# Patient Record
Sex: Female | Born: 1950 | Race: Black or African American | Hispanic: No | Smoking: Former smoker
Health system: Southern US, Community
[De-identification: ages and names within clinical notes are randomized; demographics above are authoritative.]

## PROBLEM LIST (undated history)

## (undated) DIAGNOSIS — E119 Type 2 diabetes mellitus without complications: Secondary | ICD-10-CM

## (undated) DIAGNOSIS — M1990 Unspecified osteoarthritis, unspecified site: Secondary | ICD-10-CM

## (undated) DIAGNOSIS — I639 Cerebral infarction, unspecified: Secondary | ICD-10-CM

## (undated) DIAGNOSIS — I10 Essential (primary) hypertension: Secondary | ICD-10-CM

## (undated) DIAGNOSIS — J40 Bronchitis, not specified as acute or chronic: Secondary | ICD-10-CM

## (undated) HISTORY — PX: CHOLECYSTECTOMY: SHX55

---

## 2002-08-14 ENCOUNTER — Emergency Department (HOSPITAL_COMMUNITY): Admission: EM | Admit: 2002-08-14 | Discharge: 2002-08-14 | Payer: Self-pay | Admitting: Emergency Medicine

## 2002-09-19 ENCOUNTER — Encounter: Payer: Self-pay | Admitting: Emergency Medicine

## 2002-09-19 ENCOUNTER — Emergency Department (HOSPITAL_COMMUNITY): Admission: AD | Admit: 2002-09-19 | Discharge: 2002-09-19 | Payer: Self-pay | Admitting: Emergency Medicine

## 2009-01-21 ENCOUNTER — Ambulatory Visit: Payer: Self-pay | Admitting: Diagnostic Radiology

## 2009-01-21 ENCOUNTER — Emergency Department (HOSPITAL_BASED_OUTPATIENT_CLINIC_OR_DEPARTMENT_OTHER): Admission: EM | Admit: 2009-01-21 | Discharge: 2009-01-22 | Payer: Self-pay | Admitting: Emergency Medicine

## 2009-02-15 ENCOUNTER — Ambulatory Visit: Payer: Self-pay | Admitting: Diagnostic Radiology

## 2009-02-15 ENCOUNTER — Emergency Department (HOSPITAL_BASED_OUTPATIENT_CLINIC_OR_DEPARTMENT_OTHER): Admission: EM | Admit: 2009-02-15 | Discharge: 2009-02-15 | Payer: Self-pay | Admitting: Emergency Medicine

## 2010-03-26 LAB — BASIC METABOLIC PANEL
BUN: 10 mg/dL (ref 6–23)
CO2: 29 mEq/L (ref 19–32)
Calcium: 8.8 mg/dL (ref 8.4–10.5)
Chloride: 104 mEq/L (ref 96–112)
Creatinine, Ser: 0.6 mg/dL (ref 0.4–1.2)
GFR calc Af Amer: >60 mL/min (ref >60)
GFR calc non Af Amer: >60 mL/min (ref >60)
Potassium: 4.1 mEq/L (ref 3.5–5.1)

## 2010-03-26 LAB — DIFFERENTIAL
Basophils Relative: 3 % — ABNORMAL HIGH (ref 0–1)
Eosinophils Relative: 0 % (ref 0–5)
Monocytes Relative: 9 % (ref 3–12)
Neutro Abs: 16 10*3/uL — ABNORMAL HIGH (ref 1.7–7.7)

## 2010-03-26 LAB — CBC
HCT: 40.3 % (ref 36.0–46.0)
WBC: 21.5 10*3/uL — ABNORMAL HIGH (ref 4.0–10.5)

## 2010-03-26 LAB — RAPID STREP SCREEN (MED CTR MEBANE ONLY): Streptococcus, Group A Screen (Direct): NEGATIVE

## 2013-03-31 ENCOUNTER — Emergency Department (HOSPITAL_BASED_OUTPATIENT_CLINIC_OR_DEPARTMENT_OTHER)
Admission: EM | Admit: 2013-03-31 | Discharge: 2013-03-31 | Disposition: A | Discharge status: Home / Self Care | Payer: No Typology Code available for payment source | Attending: Emergency Medicine | Admitting: Emergency Medicine

## 2013-03-31 ENCOUNTER — Encounter (HOSPITAL_BASED_OUTPATIENT_CLINIC_OR_DEPARTMENT_OTHER): Payer: Self-pay | Admitting: Emergency Medicine

## 2013-03-31 DIAGNOSIS — Z79899 Other long term (current) drug therapy: Secondary | ICD-10-CM | POA: Insufficient documentation

## 2013-03-31 DIAGNOSIS — M129 Arthropathy, unspecified: Secondary | ICD-10-CM | POA: Insufficient documentation

## 2013-03-31 DIAGNOSIS — E119 Type 2 diabetes mellitus without complications: Secondary | ICD-10-CM | POA: Insufficient documentation

## 2013-03-31 DIAGNOSIS — H6090 Unspecified otitis externa, unspecified ear: Secondary | ICD-10-CM

## 2013-03-31 DIAGNOSIS — Z7982 Long term (current) use of aspirin: Secondary | ICD-10-CM | POA: Insufficient documentation

## 2013-03-31 DIAGNOSIS — Z792 Long term (current) use of antibiotics: Secondary | ICD-10-CM | POA: Insufficient documentation

## 2013-03-31 DIAGNOSIS — Z8709 Personal history of other diseases of the respiratory system: Secondary | ICD-10-CM | POA: Insufficient documentation

## 2013-03-31 DIAGNOSIS — Z91040 Latex allergy status: Secondary | ICD-10-CM | POA: Insufficient documentation

## 2013-03-31 DIAGNOSIS — Z8673 Personal history of transient ischemic attack (TIA), and cerebral infarction without residual deficits: Secondary | ICD-10-CM | POA: Insufficient documentation

## 2013-03-31 DIAGNOSIS — H60399 Other infective otitis externa, unspecified ear: Secondary | ICD-10-CM | POA: Insufficient documentation

## 2013-03-31 DIAGNOSIS — I10 Essential (primary) hypertension: Secondary | ICD-10-CM | POA: Insufficient documentation

## 2013-03-31 DIAGNOSIS — Z87891 Personal history of nicotine dependence: Secondary | ICD-10-CM | POA: Insufficient documentation

## 2013-03-31 HISTORY — DX: Cerebral infarction, unspecified: I63.9

## 2013-03-31 HISTORY — DX: Type 2 diabetes mellitus without complications: E11.9

## 2013-03-31 HISTORY — DX: Essential (primary) hypertension: I10

## 2013-03-31 HISTORY — DX: Bronchitis, not specified as acute or chronic: J40

## 2013-03-31 HISTORY — DX: Unspecified osteoarthritis, unspecified site: M19.90

## 2013-03-31 MED ORDER — AMOXICILLIN-POT CLAVULANATE 875-125 MG PO TABS: 1.0000 | ORAL_TABLET | Freq: Two times a day (BID) | ORAL | Status: DC

## 2013-03-31 NOTE — ED Provider Notes (Signed)
CSN: [id number]     Arrival date & time 03/31/13  1926 History   First MD Initiated Contact with Patient 03/31/13 1949     Chief Complaint  Patient presents with  . Otalgia     (Consider location/radiation/quality/duration/timing/severity/associated sxs/prior Treatment) HPI Comments: Pt states that she started having left ear pain 9 days ago. Pt was seen by her pcp a couple of days ago and started on cortisporin, augmentin and hydrocodone. Pt states that in the last 2 days the symptoms worsened:no fever  The history is provided by the patient. No language interpreter was used.    Past Medical History  Diagnosis Date  . Hypertension   . Diabetes mellitus without complication   . CVA (cerebral infarction)   . Arthritis   . Bronchitis    Past Surgical History  Procedure Laterality Date  . Cesarean section    . Cholecystectomy     No family history on file. History  Substance Use Topics  . Smoking status: Former Games developermoker  . Smokeless tobacco: Not on file  . Alcohol Use: No   OB History   Grav Para Term Preterm Abortions TAB SAB Ect Mult Living                 Review of Systems  Constitutional: Negative.   Respiratory: Negative.   Cardiovascular: Negative.       Allergies  Latex and Vancomycin  Home Medications   Current Outpatient Rx  Name  Route  Sig  Dispense  Refill  . amoxicillin (AMOXIL) 125 MG chewable tablet   Oral   Chew 125 mg by mouth 3 (three) times daily.         Marland Kitchen. aspirin 81 MG tablet   Oral   Take 81 mg by mouth daily.         . citalopram (CELEXA) 10 MG tablet   Oral   Take 10 mg by mouth daily.         . hydrochlorothiazide (HYDRODIURIL) 25 MG tablet   Oral   Take 25 mg by mouth daily.         Marland Kitchen. loratadine (CLARITIN) 10 MG tablet   Oral   Take 10 mg by mouth daily.         Marland Kitchen. losartan (COZAAR) 100 MG tablet   Oral   Take 100 mg by mouth daily.         . metFORMIN (GLUCOPHAGE) 1000 MG tablet   Oral   Take 1,000 mg by  mouth 2 (two) times daily with a meal.         . NEOMYCIN-POLYMYXIN-HYDROCORTISONE (CORTISPORIN) 1 % SOLN otic solution      3 drops 4 (four) times daily.         . traMADol (ULTRAM) 50 MG tablet   Oral   Take by mouth every 6 (six) hours as needed.         Marland Kitchen. amoxicillin-clavulanate (AUGMENTIN) 875-125 MG per tablet   Oral   Take 1 tablet by mouth every 12 (twelve) hours.   14 tablet   0    BP 147/78  Pulse 86  Temp(Src) 98.9 F (37.2 C) (Oral)  Resp 16  Ht 5\' 2"  (1.575 m)  Wt 270 lb (122.471 kg)  BMI 49.37 kg/m2  SpO2 100% Physical Exam  Nursing note reviewed. HENT:  Right Ear: Tympanic membrane and external ear normal.  Mouth/Throat: Oropharynx is clear and moist.  Left ear canal is closed with swelling. Ear wick  placed  Eyes: Conjunctivae and EOM are normal. Pupils are equal, round, and reactive to light.  Cardiovascular: Normal rate and regular rhythm.   Pulmonary/Chest: Effort normal and breath sounds normal.  Musculoskeletal: Normal range of motion.    ED Course  Procedures (including critical care time) Labs Review Labs Reviewed - No data to display Imaging Review No results found.   EKG Interpretation None      MDM   Final diagnoses:  Otitis externa    Ear wick placed. Amoxicillin switched to augmentin. Pt given referral to ent    Teressa Lower, NP 03/31/13 2100

## 2013-03-31 NOTE — ED Notes (Signed)
Left ear pain x9 days.  Treated by pmd with oral and topical abx.  Started getting worse 2 days ago.  Swelling and drainage of left ear canal today.

## 2013-03-31 NOTE — ED Provider Notes (Signed)
Medical screening examination/treatment/procedure(s) were performed by non-physician practitioner and as supervising physician I was immediately available for consultation/collaboration.   EKG Interpretation None        Zeddie Njie B. Shelly Shoultz, MD 03/31/13 2225 

## 2013-03-31 NOTE — Discharge Instructions (Signed)
Otitis Externa  Otitis externa is a germ infection in the outer ear. The outer ear is the area from the eardrum to the outside of the ear. Otitis externa is sometimes called "swimmer's ear."  HOME CARE   Put drops in the ear as told by your doctor.   Only take medicine as told by your doctor.   If you have diabetes, your doctor may give you more directions. Follow your doctor's directions.   Keep all doctor visits as told.  To avoid another infection:   Keep your ear dry. Use the corner of a towel to dry your ear after swimming or bathing.   Avoid scratching or putting things inside your ear.   Avoid swimming in lakes, dirty water, or pools that use a chemical called chlorine poorly.   You may use ear drops after swimming. Combine equal amounts of white vinegar and alcohol in a bottle. Put 3 or 4 drops in each ear.  GET HELP RIGHT AWAY IF:    You have a fever.   Your ear is still red, puffy (swollen), or painful after 3 days.   You still have yellowish-white fluid (pus) coming from the ear after 3 days.   Your redness, puffiness, or pain gets worse.   You have a really bad headache.   You have redness, puffiness, pain, or tenderness behind your ear.  MAKE SURE YOU:    Understand these instructions.   Will watch your condition.   Will get help right away if you are not doing well or get worse.  Document Released: 06/13/2007 Document Revised: 03/19/2011 Document Reviewed: 01/11/2011  [employer] Patient Information 2014 [employer].

## 2016-05-12 ENCOUNTER — Encounter (HOSPITAL_BASED_OUTPATIENT_CLINIC_OR_DEPARTMENT_OTHER): Payer: Self-pay | Admitting: *Deleted

## 2016-05-12 DIAGNOSIS — R109 Unspecified abdominal pain: Secondary | ICD-10-CM | POA: Diagnosis present

## 2016-05-12 DIAGNOSIS — Z7984 Long term (current) use of oral hypoglycemic drugs: Secondary | ICD-10-CM | POA: Diagnosis not present

## 2016-05-12 DIAGNOSIS — Z79899 Other long term (current) drug therapy: Secondary | ICD-10-CM | POA: Insufficient documentation

## 2016-05-12 DIAGNOSIS — Z87891 Personal history of nicotine dependence: Secondary | ICD-10-CM | POA: Diagnosis not present

## 2016-05-12 DIAGNOSIS — I10 Essential (primary) hypertension: Secondary | ICD-10-CM | POA: Diagnosis not present

## 2016-05-12 DIAGNOSIS — M62838 Other muscle spasm: Secondary | ICD-10-CM | POA: Diagnosis not present

## 2016-05-12 DIAGNOSIS — Z7982 Long term (current) use of aspirin: Secondary | ICD-10-CM | POA: Insufficient documentation

## 2016-05-12 DIAGNOSIS — E119 Type 2 diabetes mellitus without complications: Secondary | ICD-10-CM | POA: Diagnosis not present

## 2016-05-12 LAB — URINALYSIS, ROUTINE W REFLEX MICROSCOPIC
BILIRUBIN URINE: NEGATIVE
Glucose, UA: NEGATIVE mg/dL
HGB URINE DIPSTICK: NEGATIVE
Ketones, ur: NEGATIVE mg/dL
Nitrite: NEGATIVE
PH: 5.5 (ref 5.0–8.0)
Protein, ur: NEGATIVE mg/dL
SPECIFIC GRAVITY, URINE: 1.027 (ref 1.005–1.030)

## 2016-05-12 LAB — URINALYSIS, MICROSCOPIC (REFLEX)

## 2016-05-12 NOTE — ED Triage Notes (Signed)
Pt states she has been having a pain on her left side/flank for several days. She says that the pain is causing her to be short of breath. The area hurts to touch and "feels like something is crawling". Pt has taken tylenol and medicine for indigestion without relief.

## 2016-05-13 ENCOUNTER — Emergency Department (HOSPITAL_BASED_OUTPATIENT_CLINIC_OR_DEPARTMENT_OTHER): Payer: Medicare HMO

## 2016-05-13 ENCOUNTER — Emergency Department (HOSPITAL_BASED_OUTPATIENT_CLINIC_OR_DEPARTMENT_OTHER)
Admission: EM | Admit: 2016-05-13 | Discharge: 2016-05-13 | Disposition: A | Discharge status: Home / Self Care | Payer: Medicare HMO | Attending: Physician Assistant | Admitting: Physician Assistant

## 2016-05-13 DIAGNOSIS — M62838 Other muscle spasm: Secondary | ICD-10-CM

## 2016-05-13 MED ORDER — LIDOCAINE 5 % EX PTCH: 1.0000 | MEDICATED_PATCH | CUTANEOUS | 0 refills | Status: DC

## 2016-05-13 MED ORDER — IBUPROFEN 600 MG PO TABS: 600.0000 mg | ORAL_TABLET | Freq: Three times a day (TID) | ORAL | 0 refills | Status: DC

## 2016-05-13 MED ORDER — CYCLOBENZAPRINE HCL 10 MG PO TABS: 10.0000 mg | ORAL_TABLET | Freq: Two times a day (BID) | ORAL | 0 refills | Status: DC | PRN

## 2016-05-13 MED ORDER — LIDOCAINE 5 % EX PTCH
1.0000 | MEDICATED_PATCH | CUTANEOUS | Status: DC
  Filled 2016-05-13: qty 1

## 2016-05-13 MED ORDER — IBUPROFEN 800 MG PO TABS
800.0000 mg | ORAL_TABLET | Freq: Once | ORAL | Status: AC
  Administered 2016-05-13: 800 mg via ORAL
  Filled 2016-05-13: qty 1

## 2016-05-13 NOTE — ED Provider Notes (Addendum)
[facility] Provider Note   CSN: [id number] Arrival date & time: 05/12/16  2238   By signing my name below, I, Clarisse GougeXavier Herndon, attest that this documentation has been prepared under the direction and in the presence of Aadin Gaut, Cindee Saltourteney Lyn, MD. Electronically signed, Clarisse GougeXavier Herndon, ED Scribe. 05/13/16. 12:45 AM.   History   Chief Complaint Chief Complaint  Patient presents with  . Flank Pain   The history is provided by the patient and medical records. No language interpreter was used.  Flank Pain  This is a new problem. The current episode started more than 2 days ago. The problem occurs constantly. The problem has been gradually worsening. Pertinent negatives include no chest pain.    Paige Porter is a 66 y.o. female DM and 2 surgery repairs, who presents to the Emergency Department with concern for L flank pain x 2-3 days. She describes severe, intermittent, "grabbing" pain that feels like something is moving in her side. This pain is reportedly worse with movement, contact and walking. She has attempted baths with alcohol and consumption of mustard without relief. Pt took tylenol ~1 PM yesterday. No other modifying factors noted. No activity change, chest pain or any other complaints noted at this time.   Past Medical History:  Diagnosis Date  . Arthritis   . Bronchitis   . CVA (cerebral infarction)   . Diabetes mellitus without complication (HCC)   . Hypertension     There are no active problems to display for this patient.   Past Surgical History:  Procedure Laterality Date  . CESAREAN SECTION    . CHOLECYSTECTOMY      OB History    No data available       Home Medications    Prior to Admission medications   Medication Sig Start Date End Date Taking? Authorizing Provider  amoxicillin (AMOXIL) 125 MG chewable tablet Chew 125 mg by mouth 3 (three) times daily.    [provider]  amoxicillin-clavulanate (AUGMENTIN) 875-125 MG per  tablet Take 1 tablet by mouth every 12 (twelve) hours. 03/31/13   Teressa LowerPickering, Vrinda, NP  aspirin 81 MG tablet Take 81 mg by mouth daily.    [provider]  citalopram (CELEXA) 10 MG tablet Take 10 mg by mouth daily.    [provider]  hydrochlorothiazide (HYDRODIURIL) 25 MG tablet Take 25 mg by mouth daily.    [provider]  loratadine (CLARITIN) 10 MG tablet Take 10 mg by mouth daily.    [provider]  losartan (COZAAR) 100 MG tablet Take 100 mg by mouth daily.    [provider]  metFORMIN (GLUCOPHAGE) 1000 MG tablet Take 1,000 mg by mouth 2 (two) times daily with a meal.    [provider]  NEOMYCIN-POLYMYXIN-HYDROCORTISONE (CORTISPORIN) 1 % SOLN otic solution 3 drops 4 (four) times daily.    [provider]  traMADol (ULTRAM) 50 MG tablet Take by mouth every 6 (six) hours as needed.    [provider]    Family History No family history on file.  Social History Social History  Substance Use Topics  . Smoking status: Former Games developermoker  . Smokeless tobacco: Not on file  . Alcohol use No     Allergies   Latex and Vancomycin   Review of Systems Review of Systems  Constitutional: Negative for activity change and fever.  Cardiovascular: Negative for chest pain.  Genitourinary: Positive for flank pain.  Skin: Negative for wound.  All other systems  reviewed and are negative.    Physical Exam Updated Vital Signs BP (!) 143/54 (BP Location: Right Arm)   Pulse 85   Temp 99.1 F (37.3 C) (Oral)   Resp 18   Ht 5\' 2"  (1.575 m)   Wt 260 lb (117.9 kg)   SpO2 96%   BMI 47.55 kg/m   Physical Exam  Constitutional: She is oriented to person, place, and time. She appears well-developed and well-nourished. No distress.  HENT:  Head: Normocephalic and atraumatic.  Eyes: EOM are normal.  Neck: Normal range of motion.  Cardiovascular: Normal rate, regular rhythm and normal heart sounds.   Pulmonary/Chest:  Effort normal and breath sounds normal. She exhibits tenderness.  TTP on L side wall  Abdominal: Soft. She exhibits no distension. There is no tenderness.  Musculoskeletal: Normal range of motion.  Neurological: She is alert and oriented to person, place, and time.  Skin: Skin is warm and dry.  Psychiatric: She has a normal mood and affect. Judgment normal.  Nursing note and vitals reviewed.    ED Treatments / Results  DIAGNOSTIC STUDIES: Oxygen Saturation is 96% on RA, NL by my interpretation.    COORDINATION OF CARE: 12:32 AM-Discussed next steps with pt. Pt verbalized understanding and is agreeable with the plan. Will Rx medications, review labs and order imaging. Pt prepared for d/c, advised of symptomatic care at home and return precautions.    Labs (all labs ordered are listed, but only abnormal results are displayed) Labs Reviewed  URINALYSIS, ROUTINE W REFLEX MICROSCOPIC - Abnormal; Notable for the following:       Result Value   Leukocytes, UA SMALL (*)    All other components within normal limits  URINALYSIS, MICROSCOPIC (REFLEX) - Abnormal; Notable for the following:    Bacteria, UA FEW (*)    Squamous Epithelial / LPF 0-5 (*)    All other components within normal limits    EKG  EKG Interpretation None       Radiology No results found.  Procedures Procedures (including critical care time)  Medications Ordered in ED Medications - No data to display   Initial Impression / Assessment and Plan / ED Course  I have reviewed the triage vital signs and the nursing notes.  Pertinent labs & imaging results that were available during my care of the patient were reviewed by me and considered in my medical decision making (see chart for details).     Patient is well-appearing 66 year old female presenting with chest wall pain to left lateral side. Patient reports is worse with movement. Tender to the touch. No known trauma. Patient's tried multiple home remedies  with no success. I believe this is musculoskeletal in nature. We'll make sure that there is no underlying pneumonia, or occult fracture with an x-ray. We'll give lidocaine patch, ibuprofen, muscle relaxant and follow-up with primary care physician. Does not sound pleuritic in nature nor cardiac in nature.  Final Clinical Impressions(s) / ED Diagnoses   Final diagnoses:  None    New Prescriptions New Prescriptions   No medications on file  I personally performed the services described in this documentation, which was scribed in my presence. The recorded information has been reviewed and is accurate.      Abelino Derrick, MD 05/13/16 0121    Abelino Derrick, MD 05/13/16 9562

## 2016-05-13 NOTE — Discharge Instructions (Signed)
Your chest x-ray and urine were normal. We think this is likely a muscle spasm in your chest wall. Please use the medications provided and the lidocaine patch as needed.

## 2016-05-15 ENCOUNTER — Encounter (HOSPITAL_BASED_OUTPATIENT_CLINIC_OR_DEPARTMENT_OTHER): Payer: Self-pay | Admitting: Emergency Medicine

## 2016-05-15 ENCOUNTER — Emergency Department (HOSPITAL_BASED_OUTPATIENT_CLINIC_OR_DEPARTMENT_OTHER)
Admission: EM | Admit: 2016-05-15 | Discharge: 2016-05-15 | Disposition: A | Discharge status: Home / Self Care | Payer: Medicare HMO | Attending: Emergency Medicine | Admitting: Emergency Medicine

## 2016-05-15 ENCOUNTER — Emergency Department (HOSPITAL_BASED_OUTPATIENT_CLINIC_OR_DEPARTMENT_OTHER): Payer: Medicare HMO

## 2016-05-15 DIAGNOSIS — E119 Type 2 diabetes mellitus without complications: Secondary | ICD-10-CM | POA: Diagnosis not present

## 2016-05-15 DIAGNOSIS — Z79899 Other long term (current) drug therapy: Secondary | ICD-10-CM | POA: Insufficient documentation

## 2016-05-15 DIAGNOSIS — Z7984 Long term (current) use of oral hypoglycemic drugs: Secondary | ICD-10-CM | POA: Insufficient documentation

## 2016-05-15 DIAGNOSIS — Y999 Unspecified external cause status: Secondary | ICD-10-CM | POA: Diagnosis not present

## 2016-05-15 DIAGNOSIS — I10 Essential (primary) hypertension: Secondary | ICD-10-CM | POA: Insufficient documentation

## 2016-05-15 DIAGNOSIS — S161XXA Strain of muscle, fascia and tendon at neck level, initial encounter: Secondary | ICD-10-CM

## 2016-05-15 DIAGNOSIS — Z87891 Personal history of nicotine dependence: Secondary | ICD-10-CM | POA: Diagnosis not present

## 2016-05-15 DIAGNOSIS — Z7982 Long term (current) use of aspirin: Secondary | ICD-10-CM | POA: Diagnosis not present

## 2016-05-15 DIAGNOSIS — S0990XA Unspecified injury of head, initial encounter: Secondary | ICD-10-CM | POA: Diagnosis present

## 2016-05-15 DIAGNOSIS — Y92410 Unspecified street and highway as the place of occurrence of the external cause: Secondary | ICD-10-CM | POA: Diagnosis not present

## 2016-05-15 DIAGNOSIS — Y9389 Activity, other specified: Secondary | ICD-10-CM | POA: Insufficient documentation

## 2016-05-15 DIAGNOSIS — R51 Headache: Secondary | ICD-10-CM

## 2016-05-15 DIAGNOSIS — R519 Headache, unspecified: Secondary | ICD-10-CM

## 2016-05-15 MED ORDER — NAPROXEN 250 MG PO TABS
250.0000 mg | ORAL_TABLET | Freq: Once | ORAL | Status: AC
  Administered 2016-05-15: 250 mg via ORAL
  Filled 2016-05-15: qty 1

## 2016-05-15 MED ORDER — NAPROXEN 250 MG PO TABS: 375.0000 mg | ORAL_TABLET | Freq: Once | ORAL | Status: DC

## 2016-05-15 NOTE — Discharge Instructions (Signed)
All of your imaging was normal. This is likely musculoskeletal pain and spasms. Take the Flexeril that she had at home. May use Motrin or Tylenol for pain. Warm compresses to the affected area. Warm soaks in Epsom salt. Follow-up with primary care doctor if symptoms are not improved or return to ED if her symptoms worsen. Return to the emergency department including any new  severe headaches, disequilibrium, vomiting, double vision, extremity weakness, difficulty ambulating, or any other concerning symptoms
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
## 2016-05-15 NOTE — ED Notes (Signed)
Pt verbalizes understanding of d/c instructions and denies any further needs at this time. 

## 2016-10-23 ENCOUNTER — Emergency Department (HOSPITAL_BASED_OUTPATIENT_CLINIC_OR_DEPARTMENT_OTHER): Payer: Medicare HMO

## 2016-10-23 ENCOUNTER — Emergency Department (HOSPITAL_BASED_OUTPATIENT_CLINIC_OR_DEPARTMENT_OTHER)
Admission: EM | Admit: 2016-10-23 | Discharge: 2016-10-23 | Disposition: A | Discharge status: Home / Self Care | Payer: Medicare HMO | Attending: Emergency Medicine | Admitting: Emergency Medicine

## 2016-10-23 ENCOUNTER — Encounter (HOSPITAL_BASED_OUTPATIENT_CLINIC_OR_DEPARTMENT_OTHER): Payer: Self-pay

## 2016-10-23 DIAGNOSIS — E119 Type 2 diabetes mellitus without complications: Secondary | ICD-10-CM | POA: Insufficient documentation

## 2016-10-23 DIAGNOSIS — Z87891 Personal history of nicotine dependence: Secondary | ICD-10-CM | POA: Diagnosis not present

## 2016-10-23 DIAGNOSIS — Z79899 Other long term (current) drug therapy: Secondary | ICD-10-CM | POA: Diagnosis not present

## 2016-10-23 DIAGNOSIS — K5732 Diverticulitis of large intestine without perforation or abscess without bleeding: Secondary | ICD-10-CM | POA: Diagnosis not present

## 2016-10-23 DIAGNOSIS — R1032 Left lower quadrant pain: Secondary | ICD-10-CM

## 2016-10-23 DIAGNOSIS — Z9049 Acquired absence of other specified parts of digestive tract: Secondary | ICD-10-CM | POA: Diagnosis not present

## 2016-10-23 DIAGNOSIS — Z7984 Long term (current) use of oral hypoglycemic drugs: Secondary | ICD-10-CM | POA: Insufficient documentation

## 2016-10-23 DIAGNOSIS — Z7982 Long term (current) use of aspirin: Secondary | ICD-10-CM | POA: Insufficient documentation

## 2016-10-23 DIAGNOSIS — I10 Essential (primary) hypertension: Secondary | ICD-10-CM | POA: Diagnosis not present

## 2016-10-23 DIAGNOSIS — K5792 Diverticulitis of intestine, part unspecified, without perforation or abscess without bleeding: Secondary | ICD-10-CM

## 2016-10-23 DIAGNOSIS — Z91040 Latex allergy status: Secondary | ICD-10-CM | POA: Diagnosis not present

## 2016-10-23 DIAGNOSIS — Z8673 Personal history of transient ischemic attack (TIA), and cerebral infarction without residual deficits: Secondary | ICD-10-CM | POA: Diagnosis not present

## 2016-10-23 LAB — COMPREHENSIVE METABOLIC PANEL
ALT: 12 U/L — AB (ref 14–54)
ANION GAP: 8 (ref 5–15)
AST: 17 U/L (ref 15–41)
Albumin: 3.2 g/dL — ABNORMAL LOW (ref 3.5–5.0)
Alkaline Phosphatase: 65 U/L (ref 38–126)
BUN: 13 mg/dL (ref 6–20)
CHLORIDE: 104 mmol/L (ref 101–111)
CO2: 27 mmol/L (ref 22–32)
CREATININE: 0.74 mg/dL (ref 0.44–1.00)
Calcium: 8.9 mg/dL (ref 8.9–10.3)
GFR calc non Af Amer: >60 mL/min (ref >60)
Glucose, Bld: 198 mg/dL — ABNORMAL HIGH (ref 65–99)
POTASSIUM: 3.2 mmol/L — AB (ref 3.5–5.1)
SODIUM: 139 mmol/L (ref 135–145)
Total Bilirubin: 0.7 mg/dL (ref 0.3–1.2)
Total Protein: 7.3 g/dL (ref 6.5–8.1)

## 2016-10-23 LAB — CBC WITH DIFFERENTIAL/PLATELET
Basophils Absolute: 0 10*3/uL (ref 0.0–0.1)
Basophils Relative: 0 %
EOS ABS: 0.2 10*3/uL (ref 0.0–0.7)
EOS PCT: 1 %
HCT: 37.2 % (ref 36.0–46.0)
Hemoglobin: 11.9 g/dL — ABNORMAL LOW (ref 12.0–15.0)
LYMPHS ABS: 3.3 10*3/uL (ref 0.7–4.0)
Lymphocytes Relative: 22 %
MCH: 24.9 pg — AB (ref 26.0–34.0)
MCHC: 32 g/dL (ref 30.0–36.0)
MCV: 78 fL (ref 78.0–100.0)
MONO ABS: 1.3 10*3/uL — AB (ref 0.1–1.0)
Monocytes Relative: 9 %
Neutro Abs: 10.1 10*3/uL — ABNORMAL HIGH (ref 1.7–7.7)
Neutrophils Relative %: 68 %
PLATELETS: 290 10*3/uL (ref 150–400)
RBC: 4.77 MIL/uL (ref 3.87–5.11)
RDW: 17 % — AB (ref 11.5–15.5)
WBC: 14.9 10*3/uL — AB (ref 4.0–10.5)

## 2016-10-23 LAB — URINALYSIS, ROUTINE W REFLEX MICROSCOPIC
Bilirubin Urine: NEGATIVE
GLUCOSE, UA: NEGATIVE mg/dL
HGB URINE DIPSTICK: NEGATIVE
KETONES UR: NEGATIVE mg/dL
Leukocytes, UA: NEGATIVE
Nitrite: NEGATIVE
PROTEIN: NEGATIVE mg/dL
Specific Gravity, Urine: 1.025 (ref 1.005–1.030)
pH: 6 (ref 5.0–8.0)

## 2016-10-23 LAB — LIPASE, BLOOD: LIPASE: 22 U/L (ref 11–51)

## 2016-10-23 LAB — OCCULT BLOOD X 1 CARD TO LAB, STOOL: FECAL OCCULT BLD: POSITIVE — AB

## 2016-10-23 MED ORDER — SODIUM CHLORIDE 0.9 % IV BOLUS (SEPSIS)
1000.0000 mL | Freq: Once | INTRAVENOUS | Status: AC
  Administered 2016-10-23: 1000 mL via INTRAVENOUS

## 2016-10-23 MED ORDER — IOPAMIDOL (ISOVUE-300) INJECTION 61%
100.0000 mL | Freq: Once | INTRAVENOUS | Status: AC | PRN
  Administered 2016-10-23: 100 mL via INTRAVENOUS

## 2016-10-23 MED ORDER — MORPHINE SULFATE 15 MG PO TABS: 7.5000 mg | ORAL_TABLET | ORAL | 0 refills | Status: DC | PRN

## 2016-10-23 MED ORDER — ONDANSETRON HCL 4 MG/2ML IJ SOLN
4.0000 mg | Freq: Once | INTRAMUSCULAR | Status: AC
  Administered 2016-10-23: 4 mg via INTRAVENOUS
  Filled 2016-10-23: qty 2

## 2016-10-23 MED ORDER — MORPHINE SULFATE (PF) 4 MG/ML IV SOLN
4.0000 mg | Freq: Once | INTRAVENOUS | Status: AC
Start: 2016-10-23 — End: 2016-10-23
  Administered 2016-10-23: 4 mg via INTRAVENOUS
  Filled 2016-10-23: qty 1

## 2016-10-23 MED ORDER — AMOXICILLIN-POT CLAVULANATE 875-125 MG PO TABS
1.0000 | ORAL_TABLET | Freq: Once | ORAL | Status: AC
  Administered 2016-10-23: 1 via ORAL
  Filled 2016-10-23: qty 1

## 2016-10-23 MED ORDER — AMOXICILLIN-POT CLAVULANATE 875-125 MG PO TABS: 1.0000 | ORAL_TABLET | Freq: Two times a day (BID) | ORAL | 0 refills | Status: AC

## 2016-10-23 MED ORDER — ONDANSETRON 4 MG PO TBDP: ORAL_TABLET | ORAL | 0 refills | Status: DC

## 2016-10-23 NOTE — ED Triage Notes (Signed)
C/o abd pain, diarrhea x 4 days-ambulated into ED WR-presents to triage in w/c

## 2016-10-23 NOTE — ED Notes (Signed)
Patient transported to CT 

## 2016-10-23 NOTE — ED Provider Notes (Signed)
[facility] EMERGENCY DEPARTMENT Provider Note   CSN: [id number] Arrival date & time: 10/23/16  1402     History   Chief Complaint Chief Complaint  Patient presents with  . Abdominal Pain    HPI Paige Porter is a 66 y.o. female.  66 yo F with a chief complaints of left lower quadrant abdominal pain. This been going on for the past couple days. She thinks that she had too much spicy food over the past weekend that exacerbated her diverticulitis. She denies fevers denies nausea or vomiting. Denies dysuria or increased frequency or hesitancy. Pain is described as sharp seems to come and go. She had one loose bowel movement this morning that she describes as dark.    The history is provided by the patient.  Illness  This is a new problem. The current episode started 2 days ago. The problem occurs constantly. The problem has not changed since onset.Associated symptoms include abdominal pain. Pertinent negatives include no chest pain, no headaches and no shortness of breath. Nothing aggravates the symptoms. Nothing relieves the symptoms. She has tried nothing for the symptoms. The treatment provided no relief.    Past Medical History:  Diagnosis Date  . Arthritis   . Bronchitis   . CVA (cerebral infarction)   . Diabetes mellitus without complication (HCC)   . Hypertension     There are no active problems to display for this patient.   Past Surgical History:  Procedure Laterality Date  . CESAREAN SECTION    . CHOLECYSTECTOMY      OB History    No data available       Home Medications    Prior to Admission medications   Medication Sig Start Date End Date Taking? Authorizing Provider  Pravastatin Sodium (PRAVACHOL PO) Take by mouth.   Yes [provider]  amoxicillin-clavulanate (AUGMENTIN) 875-125 MG tablet Take 1 tablet by mouth every 12 (twelve) hours. 10/23/16 11/02/16  Melene Plan, DO  aspirin 81 MG tablet Take 81 mg by mouth daily.     [provider]  citalopram (CELEXA) 10 MG tablet Take 10 mg by mouth daily.    [provider]  hydrochlorothiazide (HYDRODIURIL) 25 MG tablet Take 25 mg by mouth daily.    [provider]  ibuprofen (ADVIL,MOTRIN) 600 MG tablet Take 1 tablet (600 mg total) by mouth 3 (three) times daily. 05/13/16   Mackuen, Courteney Lyn, MD  loratadine (CLARITIN) 10 MG tablet Take 10 mg by mouth daily.    [provider]  losartan (COZAAR) 100 MG tablet Take 100 mg by mouth daily.    [provider]  metFORMIN (GLUCOPHAGE) 1000 MG tablet Take 1,000 mg by mouth 2 (two) times daily with a meal.    [provider]  morphine (MSIR) 15 MG tablet Take 0.5 tablets (7.5 mg total) by mouth every 4 (four) hours as needed for severe pain. 10/23/16   Melene Plan, DO  NEOMYCIN-POLYMYXIN-HYDROCORTISONE (CORTISPORIN) 1 % SOLN otic solution 3 drops 4 (four) times daily.    [provider]  ondansetron (ZOFRAN ODT) 4 MG disintegrating tablet  ODT q4 hours prn nausea/vomit 10/23/16   Melene Plan, DO  traMADol (ULTRAM) 50 MG tablet Take by mouth every 6 (six) hours as needed.    [provider]    Family History No family history on file.  Social History Social History  Substance Use Topics  . Smoking status: Former Games developer  . Smokeless tobacco: Never Used  .  Alcohol use No     Allergies   Latex and Vancomycin   Review of Systems Review of Systems  Constitutional: Negative for chills and fever.  HENT: Negative for congestion and rhinorrhea.   Eyes: Negative for redness and visual disturbance.  Respiratory: Negative for shortness of breath and wheezing.   Cardiovascular: Negative for chest pain and palpitations.  Gastrointestinal: Positive for abdominal pain and diarrhea (x1). Negative for nausea and vomiting.  Genitourinary: Negative for dysuria and urgency.  Musculoskeletal: Negative for arthralgias and myalgias.  Skin: Negative for pallor  and wound.  Neurological: Negative for dizziness and headaches.     Physical Exam Updated Vital Signs BP (!) 134/53 (BP Location: Right Wrist)   Pulse (!) 111   Temp 98.7 F (37.1 C) (Oral)   Resp 20   Ht  (1.575 m)   Wt 122.5 kg (270 lb)   SpO2 100%   BMI 49.38 kg/m   Physical Exam  Constitutional: She is oriented to person, place, and time. She appears well-developed and well-nourished. No distress.  HENT:  Head: Normocephalic and atraumatic.  Eyes: Pupils are equal, round, and reactive to light. EOM are normal.  Neck: Normal range of motion. Neck supple.  Cardiovascular: Normal rate and regular rhythm.  Exam reveals no gallop and no friction rub.   No murmur heard. Pulmonary/Chest: Effort normal. She has no wheezes. She has no rales.  Abdominal: Soft. She exhibits no distension and no mass. There is tenderness. There is no guarding.  Musculoskeletal: She exhibits no edema or tenderness.  Neurological: She is alert and oriented to person, place, and time.  Skin: Skin is warm and dry. She is not diaphoretic.  Psychiatric: She has a normal mood and affect. Her behavior is normal.  Nursing note and vitals reviewed.    ED Treatments / Results  Labs (all labs ordered are listed, but only abnormal results are displayed) Labs Reviewed  CBC WITH DIFFERENTIAL/PLATELET - Abnormal; Notable for the following:       Result Value   WBC 14.9 (*)    Hemoglobin 11.9 (*)    MCH 24.9 (*)    RDW 17.0 (*)    Neutro Abs 10.1 (*)    Monocytes Absolute 1.3 (*)    All other components within normal limits  COMPREHENSIVE METABOLIC PANEL - Abnormal; Notable for the following:    Potassium 3.2 (*)    Glucose, Bld 198 (*)    Albumin 3.2 (*)    ALT 12 (*)    All other components within normal limits  OCCULT BLOOD X 1 CARD TO LAB, STOOL - Abnormal; Notable for the following:    Fecal Occult Bld POSITIVE (*)    All other components within normal limits  URINALYSIS, ROUTINE W REFLEX  MICROSCOPIC  LIPASE, BLOOD    EKG  EKG Interpretation None       Radiology Ct Abdomen Pelvis W Contrast  Result Date: 10/23/2016 CLINICAL DATA:  Abdominal pain for 2 days EXAM: CT ABDOMEN AND PELVIS WITH CONTRAST TECHNIQUE: Multidetector CT imaging of the abdomen and pelvis was performed using the standard protocol following bolus administration of intravenous contrast. CONTRAST:  ISOVUE-300 IOPAMIDOL (ISOVUE-300) INJECTION 61% COMPARISON:  None. FINDINGS: Lower chest: There is atelectatic change in the anterior left base. There is slight lower lobe bronchiectatic change bilaterally. Lung bases otherwise are clear. Hepatobiliary: No focal liver lesions are appreciable. Gallbladder is absent. There is no biliary duct dilatation. Pancreas: No pancreatic mass or inflammatory focus.  Spleen: No splenic lesions are evident. Adrenals/Urinary Tract: Adrenals appear normal bilaterally. There is a cyst arising from the anterior aspect of the lower pole of the right kidney measuring 1.3 x 1.3 cm. There is a cyst in the medial mid right kidney measuring 0.8 x 0.8 cm. There is a cyst in the lower pole of the left kidney measuring 7 x 7 mm. There is no appreciable hydronephrosis on either side. There is no renal or ureteral calculus on either side. Urinary bladder is midline with wall thickness within normal limits. Stomach/Bowel: There are multiple sigmoid diverticula. Several irregular diverticular are noted in the mid to distal sigmoid region. There is wall thickening and surrounding mesenteric thickening in the mid sigmoid region consistent with diverticulitis. There is no abscess or perforation evident in the sigmoid region. No diverticulitis is evident elsewhere. There are scattered diverticula elsewhere in the colon, particularly in the descending colon region. There is no evident bowel obstruction. No free air or portal venous air. Vascular/Lymphatic: There are foci of atherosclerotic calcification in  the aorta. There is no abdominal aortic aneurysm. Major mesenteric vessels appear patent. There is no adenopathy in the abdomen or pelvis. Reproductive: Uterus is anteverted. There is no evident pelvic mass. Other: Appendix appears normal. No frank abscess seen in the abdomen or pelvis. No ascites evident. Musculoskeletal: There is scarring in the anterior abdominal wall, potentially postoperative in etiology. There are foci of degenerative change in the lower thoracic and lumbar regions. There is moderately severe spinal stenosis at L3-4 due to disc protrusion and diffuse bony hypertrophy. Similar changes are noted just inferior to the L4-5 interspace. No blastic or lytic bone lesions are evident. No intramuscular lesions are evident. IMPRESSION: 1. Mid to distal sigmoid diverticulitis. No perforation or frank abscess seen. 2. No abscess elsewhere in the abdomen or pelvis. No bowel obstruction. Appendix appears normal. Scattered diverticula elsewhere in the colon without diverticulitis apart from the changes in the sigmoid colon. 3. Moderately severe spinal stenosis at L3-4 and L4-5, multifocal in etiology. 4.  No renal or ureteral calculus.  No hydronephrosis. 5.  Aortic atherosclerosis. 6.  Gallbladder absent. 7. Scarring anterior abdominal wall. No abnormal fluid in this area. Aortic Atherosclerosis (ICD10-I70.0). Electronically Signed   By: Bretta Bang III M.D.   On: 10/23/2016 15:48    Procedures Procedures (including critical care time)  Medications Ordered in ED Medications  amoxicillin-clavulanate (AUGMENTIN) 875-125 MG per tablet 1 tablet (not administered)  sodium chloride 0.9 % bolus 1,000 mL (1,000 mLs Intravenous New Bag/Given 10/23/16 1432)  morphine 4 MG/ML injection 4 mg (4 mg Intravenous Given 10/23/16 1433)  ondansetron (ZOFRAN) injection 4 mg (4 mg Intravenous Given 10/23/16 1433)  iopamidol (ISOVUE-300) 61 % injection 100 mL (100 mLs Intravenous Contrast Given 10/23/16 1511)      Initial Impression / Assessment and Plan / ED Course  I have reviewed the triage vital signs and the nursing notes.  Pertinent labs & imaging results that were available during my care of the patient were reviewed by me and considered in my medical decision making (see chart for details).     66 yo F with a chief complaint of left lower quadrant abdominal pain. Feels like when she had had a prior diverticulitis. On my exam she has significant tenderness to left lower quadrant. Will treat her pain obtain a CT to evaluate for intra-abdominal pathology.  CT with diverticulitis.  Start on abx.  D/c home.   3:58 PM:  I have  discussed the diagnosis/risks/treatment options with the patient and family and believe the pt to be eligible for discharge home to follow-up with PCP. We also discussed returning to the ED immediately if new or worsening sx occur. We discussed the sx which are most concerning (e.g., sudden worsening pain, fever, inability to tolerate by mouth) that necessitate immediate return. Medications administered to the patient during their visit and any new prescriptions provided to the patient are listed below.  Medications given during this visit Medications  amoxicillin-clavulanate (AUGMENTIN) 875-125 MG per tablet 1 tablet (not administered)  sodium chloride 0.9 % bolus 1,000 mL (1,000 mLs Intravenous New Bag/Given 10/23/16 1432)  morphine 4 MG/ML injection 4 mg (4 mg Intravenous Given 10/23/16 1433)  ondansetron (ZOFRAN) injection 4 mg (4 mg Intravenous Given 10/23/16 1433)  iopamidol (ISOVUE-300) 61 % injection 100 mL (100 mLs Intravenous Contrast Given 10/23/16 1511)     The patient appears reasonably screen and/or stabilized for discharge and I doubt any other medical condition or other [facility] requiring further screening, evaluation, or treatment in the ED at this time prior to discharge.    Final Clinical Impressions(s) / ED Diagnoses   Final diagnoses:  Left lower  quadrant pain  Diverticulitis    New Prescriptions New Prescriptions   AMOXICILLIN-CLAVULANATE (AUGMENTIN) 875-125 MG TABLET    Take 1 tablet by mouth every 12 (twelve) hours.   MORPHINE (MSIR) 15 MG TABLET    Take 0.5 tablets (7.5 mg total) by mouth every 4 (four) hours as needed for severe pain.   ONDANSETRON (ZOFRAN ODT) 4 MG DISINTEGRATING TABLET     ODT q4 hours prn nausea/vomit     Melene Plan, DO 10/23/16 1558

## 2016-10-23 NOTE — Discharge Instructions (Signed)
Follow up with your PCP.  You may need to see a GI doctor.  Return if you cant eat or drink anything, sudden worsening pain or fever.

## 2018-12-02 ENCOUNTER — Other Ambulatory Visit: Payer: Self-pay

## 2018-12-02 ENCOUNTER — Emergency Department (HOSPITAL_BASED_OUTPATIENT_CLINIC_OR_DEPARTMENT_OTHER): Payer: Medicare HMO

## 2018-12-02 ENCOUNTER — Emergency Department (HOSPITAL_BASED_OUTPATIENT_CLINIC_OR_DEPARTMENT_OTHER)
Admission: EM | Admit: 2018-12-02 | Discharge: 2018-12-02 | Disposition: A | Discharge status: Home / Self Care | Payer: Medicare HMO | Attending: Emergency Medicine | Admitting: Emergency Medicine

## 2018-12-02 ENCOUNTER — Encounter (HOSPITAL_BASED_OUTPATIENT_CLINIC_OR_DEPARTMENT_OTHER): Payer: Self-pay

## 2018-12-02 DIAGNOSIS — Z8673 Personal history of transient ischemic attack (TIA), and cerebral infarction without residual deficits: Secondary | ICD-10-CM | POA: Insufficient documentation

## 2018-12-02 DIAGNOSIS — Z7982 Long term (current) use of aspirin: Secondary | ICD-10-CM | POA: Insufficient documentation

## 2018-12-02 DIAGNOSIS — Z20822 Contact with and (suspected) exposure to covid-19: Secondary | ICD-10-CM

## 2018-12-02 DIAGNOSIS — Z87891 Personal history of nicotine dependence: Secondary | ICD-10-CM | POA: Diagnosis not present

## 2018-12-02 DIAGNOSIS — H60503 Unspecified acute noninfective otitis externa, bilateral: Secondary | ICD-10-CM | POA: Insufficient documentation

## 2018-12-02 DIAGNOSIS — Z79899 Other long term (current) drug therapy: Secondary | ICD-10-CM | POA: Diagnosis not present

## 2018-12-02 DIAGNOSIS — E119 Type 2 diabetes mellitus without complications: Secondary | ICD-10-CM | POA: Diagnosis not present

## 2018-12-02 DIAGNOSIS — I10 Essential (primary) hypertension: Secondary | ICD-10-CM | POA: Insufficient documentation

## 2018-12-02 DIAGNOSIS — Z7984 Long term (current) use of oral hypoglycemic drugs: Secondary | ICD-10-CM | POA: Diagnosis not present

## 2018-12-02 DIAGNOSIS — Z91040 Latex allergy status: Secondary | ICD-10-CM | POA: Insufficient documentation

## 2018-12-02 DIAGNOSIS — Z20828 Contact with and (suspected) exposure to other viral communicable diseases: Secondary | ICD-10-CM | POA: Insufficient documentation

## 2018-12-02 DIAGNOSIS — R05 Cough: Secondary | ICD-10-CM | POA: Diagnosis present

## 2018-12-02 LAB — SARS CORONAVIRUS 2 AG (30 MIN TAT): SARS Coronavirus 2 Ag: NEGATIVE

## 2018-12-02 MED ORDER — CIPRO HC 0.2-1 % OT SUSP: 3.0000 [drp] | Freq: Two times a day (BID) | OTIC | 0 refills | Status: AC

## 2018-12-02 NOTE — ED Notes (Signed)
Pt maintained O2 sat 96-98% while ambulating

## 2018-12-02 NOTE — Discharge Instructions (Addendum)
You have been diagnosed today with Otitis External and Suspected COVID-19 virus.  At this time there does not appear to be the presence of an emergent medical condition, however there is always the potential for conditions to change. Please read and follow the below instructions.  Please return to the Emergency Department immediately for any new or worsening symptoms or if your symptoms do not improve within 3 days. Please be sure to follow up with your Primary Care Provider within one week regarding your visit today; please call their office to schedule an appointment even if you are feeling better for a follow-up visit. You may use the ciprofloxacin/hydrocortisone drops as prescribed in both of your ears to help with your symptoms.  You may follow-up with the ear nose and throat specialist Dr. Erik Obey on your discharge paperwork for further evaluation of your ear pain. Despite your negative Covid test today there is still high suspicion that you have the COVID-19 virus as your household contact is positive.  Please continue to self isolate until symptom-free x2 weeks to avoid any potential spread of your virus.  Please call your primary care doctor's office today to schedule to schedule a televisit follow-up regarding your visit today.  Return to the emergency department immediately for any new or worsening symptoms.  You may check your Covid test results on your MyChart account in the next 1-2 days.  Get help right away if: You have trouble breathing. You have pain or pressure in your chest. You have confusion. You have bluish lips and fingernails. You have difficulty waking from sleep. You have symptoms that get worse. You have a fever. Your ear is still red, swollen, or painful after 3 days. You still have pus coming from your ear after 3 days. Your redness, swelling, or pain gets worse. You have a really bad headache. You have redness, swelling, pain, or tenderness behind your ear. You  have any new/concerning or worsening of symptoms  Please read the additional information packets attached to your discharge summary.  Do not take your medicine if  develop an itchy rash, swelling in your mouth or lips, or difficulty breathing; call 911 and seek immediate emergency medical attention if this occurs.  Note: Portions of this text may have been transcribed using voice recognition software. Every effort was made to ensure accuracy; however, inadvertent computerized transcription errors may still be present.

## 2018-12-02 NOTE — ED Provider Notes (Addendum)
[place] EMERGENCY DEPARTMENT Provider Note   CSN: [id number] Arrival date & time: 12/02/18  1639     History   Chief Complaint Chief Complaint  Patient presents with  . Cough    HPI Paige Porter is a 68 y.o. female history of CVA, diabetes, hypertension, bronchitis, arthritis, cholecystectomy.  Patient presents with her cousin today both of whom have been exposed to COVID-19 virus through the patient's son 3 weeks ago.  Patient reports that over the past 3 weeks she had "a bronchitis flare" which has since resolved.  Patient reports that she had been feeling until 1 week ago when she was given both the Pneumovax and the flu shot.  Patient reports that since receiving these vaccines 1 week ago she has felt "achy".  She reports generalized body aches for the past 1 week mild nonradiating constant no clear aggravating or alleviating factors.   Patient reports that her primary concern at this time is bilateral ear pain.  She reports drainage and swelling from both of her ears over the past 1 week, she describes a throbbing pain constant worsened with touching the area no alleviating factors, no radiation of pain.  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  Patient reports that she has not had swimmer's ear in the past several years.  Of note patient reports that she would not have checked into the emergency department today if she was not already bringing her cousin in for evaluation.  Denies fever/chills, headache, vision changes, neck pain/stiffness, chest pain/shortness of breath, hemoptysis, cough, abdominal pain, nausea/vomiting, diarrhea, swelling/color change or any additional concerns.      HPI  Past Medical History:  Diagnosis Date  . Arthritis   . Bronchitis   . CVA (cerebral infarction)   . Diabetes mellitus without complication ([place])   . Hypertension     There are no active problems to display  for this patient.   Past Surgical History:  Procedure Laterality Date  . CESAREAN SECTION    . CHOLECYSTECTOMY       OB History   No obstetric history on file.      Home Medications    Prior to Admission medications   Medication Sig Start Date End Date Taking? Authorizing Provider  aspirin 81 MG tablet Take 81 mg by mouth daily.    [provider]  citalopram (CELEXA) 10 MG tablet Take 10 mg by mouth daily.    [provider]  hydrochlorothiazide (HYDRODIURIL) 25 MG tablet Take 25 mg by mouth daily.    [provider]  ibuprofen (ADVIL,MOTRIN) 600 MG tablet Take 1 tablet (600 mg total) by mouth 3 (three) times daily. 05/13/16   Mackuen, Courteney Lyn, MD  loratadine (CLARITIN) 10 MG tablet Take 10 mg by mouth daily.    [provider]  losartan (COZAAR) 100 MG tablet Take 100 mg by mouth daily.    [provider]  metFORMIN (GLUCOPHAGE) 1000 MG tablet Take 1,000 mg by mouth 2 (two) times daily with a meal.    [provider]  morphine (MSIR) 15 MG tablet Take 0.5 tablets (7.5 mg total) by mouth every 4 (four) hours as needed for severe pain. 10/23/16   Deno Etienne, DO  NEOMYCIN-POLYMYXIN-HYDROCORTISONE (CORTISPORIN) 1 % SOLN otic solution 3 drops 4 (four) times daily.    [provider]  ondansetron (ZOFRAN ODT) 4 MG disintegrating tablet 4mg  ODT q4 hours prn nausea/vomit 10/23/16  Melene PlanFloyd, Dan, DO  Pravastatin Sodium (PRAVACHOL PO) Take by mouth.    [provider]  traMADol (ULTRAM) 50 MG tablet Take by mouth every 6 (six) hours as needed.    [provider]    Family History No family history on file.  Social History Social History   Tobacco Use  . Smoking status: Former Games developermoker  . Smokeless tobacco: Never Used  Substance Use Topics  . Alcohol use: No  . Drug use: No     Allergies   Latex and Vancomycin   Review of Systems Review of Systems Ten systems are reviewed and are negative for  acute change except as noted in the HPI   Physical Exam Updated Vital Signs BP (!) 152/81 (BP Location: Left Arm)   Pulse 87   Temp 98.7 F (37.1 C) (Oral)   Resp 16   Ht 5\' 2"  (1.575 m)   Wt 113.4 kg   SpO2 100%   BMI 45.73 kg/m   Physical Exam Constitutional:      General: She is not in acute distress.    Appearance: Normal appearance. She is well-developed. She is not ill-appearing or diaphoretic.  HENT:     Head: Normocephalic and atraumatic.     Jaw: There is normal jaw occlusion. No trismus.     Right Ear: External ear normal. Drainage and swelling present. Tympanic membrane is not perforated or bulging.     Left Ear: External ear normal. Drainage and swelling present. Tympanic membrane is not perforated or bulging.     Ears:     Comments: Crusting and scant drainage from bilateral canals consistent with otitis externa.    Nose: Rhinorrhea present. Rhinorrhea is clear.     Right Nostril: No epistaxis.     Left Nostril: No epistaxis.     Mouth/Throat:     Mouth: Mucous membranes are moist.     Pharynx: Oropharynx is clear.  Eyes:     General: Vision grossly intact. Gaze aligned appropriately.     Extraocular Movements: Extraocular movements intact.     Conjunctiva/sclera: Conjunctivae normal.     Pupils: Pupils are equal, round, and reactive to light.  Neck:     Musculoskeletal: Full passive range of motion without pain, normal range of motion and neck supple.     Trachea: Trachea and phonation normal. No tracheal deviation.     Meningeal: Brudzinski's sign absent.  Cardiovascular:     Rate and Rhythm: Normal rate and regular rhythm.     Pulses: Normal pulses.     Heart sounds: Normal heart sounds.  Pulmonary:     Effort: Pulmonary effort is normal. No respiratory distress.     Breath sounds: Normal breath sounds.  Abdominal:     General: There is no distension.     Palpations: Abdomen is soft.     Tenderness: There is no abdominal tenderness. There is no  guarding or rebound.  Musculoskeletal: Normal range of motion.  Skin:    General: Skin is warm and dry.  Neurological:     Mental Status: She is alert.     GCS: GCS eye subscore is 4. GCS verbal subscore is 5. GCS motor subscore is 6.     Comments: Speech is clear and goal oriented, follows commands Major Cranial nerves without deficit, no facial droop Moves extremities without ataxia, coordination intact  Psychiatric:        Behavior: Behavior normal.      ED Treatments / Results  Labs (all labs ordered are listed, but only abnormal results are displayed) Labs Reviewed  SARS CORONAVIRUS 2 AG (30 MIN TAT)  NOVEL CORONAVIRUS, NAA (HOSP ORDER, SEND-OUT TO REF LAB; TAT 18-24 HRS)    EKG None  Radiology Dg Chest Portable 1 View  Result Date: 12/02/2018 CLINICAL DATA:  Cough COVID exposure EXAM: PORTABLE CHEST 1 VIEW COMPARISON:  02/25/2015, 05/13/2016 FINDINGS: The heart size and mediastinal contours are within normal limits. Both lungs are clear. The visualized skeletal structures are unremarkable. IMPRESSION: No active disease. Electronically Signed   By: Jasmine Pang M.D.   On: 12/02/2018 18:02    Procedures Procedures (including critical care time)  Medications Ordered in ED Medications - No data to display   Initial Impression / Assessment and Plan / ED Course  I have reviewed the triage vital signs and the nursing notes.  Pertinent labs & imaging results that were available during my care of the patient were reviewed by me and considered in my medical decision making (see chart for details).    68 year old female presents today with otitis externa of the bilateral ear canals, no rupture or occlusion.  Patient is afebrile, well appearing and in no acute distress.  There is no concern for mastoiditis, malignant otitis externa, cellulitis, meningitis or other acute pathologies at this time.  Plan to treat with Cipro ofloxacin/hydrocortisone drops and for PCP/ENT  follow-up for resolution.  Additionally patient presents today for concern of COVID-19 virus.  Her cousin with whom she lives with tested positive today.  Patient is without chest pain, shortness of breath or fever.  She reports that aside from her bilateral ear pain and drainage she is feeling well at this time and has no concerns.  Rapid Covid negative, additionally chest x-ray without active disease.  She is afebrile, no tachycardia or hypoxia on room air.  She is overall well-appearing and in no acute distress there is no indication for further evaluation in the ED at this time or for admission.  She was ambulated in the room without tachycardia or hypoxia by nursing staff.  She will follow-up with her PCP via televisit in the next few days for recheck and return to emergency department immediately for any new or worsening symptoms.  Patient advised that despite negative Covid test here in the emergency department I have high suspicion that she may have the Covid virus as her cousin whom she lives with is positive today, send out test is pending.  Patient is aware to check her MyChart account for results in the next 1-2 days.  She is aware to self quarantine despite test results until symptom-free x2 weeks to avoid spread of any potential virus.  At this time there does not appear to be any evidence of an acute emergency medical condition and the patient appears stable for discharge with appropriate outpatient follow up. Diagnosis was discussed with patient who verbalizes understanding of care plan and is agreeable to discharge. I have discussed return precautions with patient who verbalizes understanding of return precautions. Patient encouraged to follow-up with their PCP. All questions answered. Patient has been discharged in good condition.  Patient's case discussed with Dr. Rush Landmark who agrees with plan to discharge with follow-up.   Paige Porter was evaluated in Emergency Department on  12/02/2018 for the symptoms described in the history of present illness. She was evaluated in the context of the global COVID-19 pandemic, which necessitated consideration that the patient might be at risk for infection with the  SARS-CoV-2 virus that causes COVID-19. Institutional protocols and algorithms that pertain to the evaluation of patients at risk for COVID-19 are in a state of rapid change based on information released by regulatory bodies including the CDC and federal and state organizations. These policies and algorithms were followed during the patient's care in the ED.   Note: Portions of this report may have been transcribed using voice recognition software. Every effort was made to ensure accuracy; however, inadvertent computerized transcription errors may still be present. Final Clinical Impressions(s) / ED Diagnoses   Final diagnoses:  Acute otitis externa of both ears, unspecified type  Suspected COVID-19 virus infection    ED Discharge Orders    None       Elizabeth Palau 12/02/18 2154    Bill Salinas, PA-C 12/02/18 2156    Tegeler, Canary Brim, MD 12/02/18 [phone]

## 2018-12-02 NOTE — ED Triage Notes (Signed)
Pt c/o flu like sx started ~2 weeks ago after having PNA vaccine-pt with +covid exposure 3 weeks ago-NAD-steady gait

## 2018-12-05 LAB — NOVEL CORONAVIRUS, NAA (HOSP ORDER, SEND-OUT TO REF LAB; TAT 18-24 HRS): SARS-CoV-2, NAA: NOT DETECTED

## 2018-12-09 ENCOUNTER — Telehealth (HOSPITAL_COMMUNITY): Payer: Self-pay

## 2019-01-14 ENCOUNTER — Encounter (HOSPITAL_BASED_OUTPATIENT_CLINIC_OR_DEPARTMENT_OTHER): Payer: Self-pay | Admitting: *Deleted

## 2019-01-14 ENCOUNTER — Emergency Department (HOSPITAL_BASED_OUTPATIENT_CLINIC_OR_DEPARTMENT_OTHER)
Admission: EM | Admit: 2019-01-14 | Discharge: 2019-01-14 | Disposition: A | Discharge status: Home / Self Care | Payer: Medicare HMO | Attending: Emergency Medicine | Admitting: Emergency Medicine

## 2019-01-14 ENCOUNTER — Other Ambulatory Visit: Payer: Self-pay

## 2019-01-14 DIAGNOSIS — Y998 Other external cause status: Secondary | ICD-10-CM | POA: Insufficient documentation

## 2019-01-14 DIAGNOSIS — Y9389 Activity, other specified: Secondary | ICD-10-CM | POA: Diagnosis not present

## 2019-01-14 DIAGNOSIS — R42 Dizziness and giddiness: Secondary | ICD-10-CM | POA: Diagnosis not present

## 2019-01-14 DIAGNOSIS — Y929 Unspecified place or not applicable: Secondary | ICD-10-CM | POA: Diagnosis not present

## 2019-01-14 DIAGNOSIS — Z5321 Procedure and treatment not carried out due to patient leaving prior to being seen by health care provider: Secondary | ICD-10-CM | POA: Insufficient documentation
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
## 2021-04-06 ENCOUNTER — Emergency Department (HOSPITAL_BASED_OUTPATIENT_CLINIC_OR_DEPARTMENT_OTHER): Payer: Medicare HMO

## 2021-04-06 ENCOUNTER — Other Ambulatory Visit: Payer: Self-pay

## 2021-04-06 ENCOUNTER — Emergency Department (HOSPITAL_BASED_OUTPATIENT_CLINIC_OR_DEPARTMENT_OTHER)
Admission: EM | Admit: 2021-04-06 | Discharge: 2021-04-07 | Disposition: A | Discharge status: Home / Self Care | Payer: Medicare HMO | Attending: Emergency Medicine | Admitting: Emergency Medicine

## 2021-04-06 ENCOUNTER — Encounter (HOSPITAL_BASED_OUTPATIENT_CLINIC_OR_DEPARTMENT_OTHER): Payer: Self-pay

## 2021-04-06 DIAGNOSIS — E119 Type 2 diabetes mellitus without complications: Secondary | ICD-10-CM | POA: Insufficient documentation

## 2021-04-06 DIAGNOSIS — I10 Essential (primary) hypertension: Secondary | ICD-10-CM | POA: Diagnosis not present

## 2021-04-06 DIAGNOSIS — R42 Dizziness and giddiness: Secondary | ICD-10-CM | POA: Insufficient documentation

## 2021-04-06 LAB — CBC WITH DIFFERENTIAL/PLATELET
Abs Immature Granulocytes: 0.03 10*3/uL (ref 0.00–0.07)
Basophils Absolute: 0.1 10*3/uL (ref 0.0–0.1)
Basophils Relative: 1 %
Eosinophils Absolute: 0.4 10*3/uL (ref 0.0–0.5)
Eosinophils Relative: 4 %
HCT: 37.6 % (ref 36.0–46.0)
Hemoglobin: 11.8 g/dL — ABNORMAL LOW (ref 12.0–15.0)
Immature Granulocytes: 0 %
Lymphocytes Relative: 32 %
Lymphs Abs: 3.6 10*3/uL (ref 0.7–4.0)
MCH: 25.1 pg — ABNORMAL LOW (ref 26.0–34.0)
MCHC: 31.4 g/dL (ref 30.0–36.0)
MCV: 79.8 fL — ABNORMAL LOW (ref 80.0–100.0)
Monocytes Absolute: 1.1 10*3/uL — ABNORMAL HIGH (ref 0.1–1.0)
Monocytes Relative: 10 %
Neutro Abs: 5.9 10*3/uL (ref 1.7–7.7)
Neutrophils Relative %: 53 %
Platelets: 253 10*3/uL (ref 150–400)
RBC: 4.71 MIL/uL (ref 3.87–5.11)
RDW: 18.6 % — ABNORMAL HIGH (ref 11.5–15.5)
WBC: 11.1 10*3/uL — ABNORMAL HIGH (ref 4.0–10.5)
nRBC: 0 % (ref 0.0–0.2)

## 2021-04-06 LAB — BASIC METABOLIC PANEL
Anion gap: 8 (ref 5–15)
BUN: 23 mg/dL (ref 8–23)
CO2: 28 mmol/L (ref 22–32)
Calcium: 9 mg/dL (ref 8.9–10.3)
Chloride: 99 mmol/L (ref 98–111)
Creatinine, Ser: 1.02 mg/dL — ABNORMAL HIGH (ref 0.44–1.00)
GFR, Estimated: 59 mL/min — ABNORMAL LOW (ref >60)
Glucose, Bld: 95 mg/dL (ref 70–99)
Potassium: 3.9 mmol/L (ref 3.5–5.1)
Sodium: 135 mmol/L (ref 135–145)

## 2021-04-06 LAB — CBG MONITORING, ED: Glucose-Capillary: 131 mg/dL — ABNORMAL HIGH (ref 70–99)

## 2021-04-06 MED ORDER — MECLIZINE HCL 25 MG PO TABS
25.0000 mg | ORAL_TABLET | Freq: Once | ORAL | Status: AC
  Administered 2021-04-06: 25 mg via ORAL
  Filled 2021-04-06: qty 1

## 2021-04-06 NOTE — ED Notes (Signed)
This RN emphasized the importance of going straight to [facility] without stopping.  Patient and family verbalized understanding.  ?

## 2021-04-06 NOTE — ED Notes (Signed)
Attempted straight stick in triage. Attempt was unsuccessful. RN notified.  ?

## 2021-04-06 NOTE — ED Provider Notes (Signed)
? ?Emergency Department Provider Note ? ? ?I have reviewed the triage vital signs and the nursing notes. ? ? ?HISTORY ? ?Chief Complaint ?Dizziness ? ? ?HPI ?Paige Porter is a 71 y.o. female with past medical history of prior stroke and diabetes presents with persistent vertigo over the past 2 weeks.  Patient with vertigo sensation, worse with movement but sometimes at rest.  No weakness/numbness.  No fevers.  No falls or head injury.  She has tried meclizine in the past and took a friend's meclizine today with no relief. ? ?Past Medical History:  ?Diagnosis Date  ? Arthritis   ? Bronchitis   ? CVA (cerebral infarction)   ? Diabetes mellitus without complication ([facility])   ? Hypertension   ? ? ?Review of Systems ? ?Constitutional: No fever/chills ?Eyes: No visual changes. ?ENT: No sore throat. Positive vertigo.  ?Cardiovascular: Positive burning chest pain. ?Respiratory: Denies shortness of breath. ?Gastrointestinal: No abdominal pain.  No nausea, no vomiting.  No diarrhea.  No constipation. ?Genitourinary: Negative for dysuria. ?Musculoskeletal: Negative for back pain. ?Skin: Negative for rash. ?Neurological: Negative for headaches, focal weakness or numbness. ? ? ?____________________________________________ ? ? ?PHYSICAL EXAM: ? ?VITAL SIGNS: ?ED Triage Vitals  ?Enc Vitals Group  ?   BP 04/06/21 1941 140/88  ?   Pulse Rate 04/06/21 1941 83  ?   Resp 04/06/21 1941 20  ?   Temp 04/06/21 1941 98.4 ?F (36.9 ?C)  ?   Temp Source 04/06/21 1941 Oral  ?   SpO2 04/06/21 1941 99 %  ?   Weight 04/06/21 1939 260 lb (117.9 kg)  ?   Height 04/06/21 1939 5\' 2"  (1.575 m)  ? ?Constitutional: Alert and oriented. Well appearing and in no acute distress. ?Eyes: Conjunctivae are normal. PERRL. EOMI. No nystagmus.  ?Head: Atraumatic. ?Nose: No congestion/rhinnorhea. ?Mouth/Throat: Mucous membranes are moist.  ?Neck: No stridor.   ?Cardiovascular: Normal rate, regular rhythm. Good peripheral circulation. Grossly normal heart  sounds.   ?Respiratory: Normal respiratory effort.  No retractions. Lungs CTAB. ?Gastrointestinal: Soft and nontender. No distention.  ?Musculoskeletal: No lower extremity tenderness nor edema. No gross deformities of extremities. ?Neurologic:  Normal speech and language. No facial asymmetry. 5/5 strength in the bilateral upper/lower extremities. Slow finger to nose testing.  ?Skin:  Skin is warm, dry and intact. No rash noted. ? ?____________________________________________ ?  ?LABS ?(all labs ordered are listed, but only abnormal results are displayed) ? ?Labs Reviewed  ?CBC WITH DIFFERENTIAL/PLATELET - Abnormal; Notable for the following components:  ?    Result Value  ? WBC 11.1 (*)   ? Hemoglobin 11.8 (*)   ? MCV 79.8 (*)   ? MCH 25.1 (*)   ? RDW 18.6 (*)   ? Monocytes Absolute 1.1 (*)   ? All other components within normal limits  ?CBG MONITORING, ED - Abnormal; Notable for the following components:  ? Glucose-Capillary 131 (*)   ? All other components within normal limits  ?BASIC METABOLIC PANEL  ?TROPONIN I (HIGH SENSITIVITY)  ?TROPONIN I (HIGH SENSITIVITY)  ? ?____________________________________________ ? ?EKG ? ? EKG Interpretation ? ?Date/Time:  Thursday April 06 2021 19:45:20 EDT ?Ventricular Rate:  74 ?PR Interval:  180 ?QRS Duration: 146 ?QT Interval:  414 ?QTC Calculation: 459 ?R Axis:   -13 ?Text Interpretation: Normal sinus rhythm Right bundle branch block Abnormal ECG When compared with ECG of 12-May-2016 23:06, PREVIOUS ECG IS PRESENT Similar to prior Confirmed by 14-May-2016 [phone]) on 04/06/2021 7:53:01 PM ?  ? ?  ? ? ?  ____________________________________________ ? ?RADIOLOGY ? ?DG Chest 2 View ? ?Result Date: 04/06/2021 ?CLINICAL DATA:  dyspnea on exertion EXAM: CHEST - 2 VIEW COMPARISON:  Chest x-ray 12/02/2018 FINDINGS: The heart and mediastinal contours are unchanged. No focal consolidation. No pulmonary edema. No pleural effusion. No pneumothorax. No acute osseous abnormality. Total reverse  right shoulder arthroplasty. IMPRESSION: No active cardiopulmonary disease. Electronically Signed   By: Tish Frederickson M.D.   On: 04/06/2021 20:09  ? ?CT HEAD WO CONTRAST ( ) ? ?Result Date: 04/06/2021 ?CLINICAL DATA:  Neuro deficit, acute, stroke suspected vertigo EXAM: CT HEAD WITHOUT CONTRAST TECHNIQUE: Contiguous axial images were obtained from the base of the skull through the vertex without intravenous contrast. RADIATION DOSE REDUCTION: This exam was performed according to the departmental dose-optimization program which includes automated exposure control, adjustment of the mA and/or kV according to patient size and/or use of iterative reconstruction technique. COMPARISON:  05/15/2016 FINDINGS: Brain: Cystic area in the right posterior fossa compatible with arachnoid cyst, unchanged. No hemorrhage, hydrocephalus or acute infarction. Mild chronic small vessel disease throughout the deep white matter. Vascular: No hyperdense vessel or unexpected calcification. Skull: No acute calvarial abnormality. Sinuses/Orbits: No acute findings Other: None IMPRESSION: Mild chronic small vessel disease throughout the deep white matter. Stable right posterior fossa arachnoid cyst. No acute intracranial abnormality. Electronically Signed   By: Charlett Nose M.D.   On: 04/06/2021 21:41   ? ?____________________________________________ ? ? ?PROCEDURES ? ?Procedure(s) performed:  ? ?Procedures ? ?None  ?____________________________________________ ? ? ?INITIAL IMPRESSION / ASSESSMENT AND PLAN / ED COURSE ? ?Pertinent labs & imaging results that were available during my care of the patient were reviewed by me and considered in my medical decision making (see chart for details). ?  ?This patient is Presenting for Evaluation of vertigo, which does require a range of treatment options, and is a complaint that involves a high risk of morbidity and mortality. ? ?The Differential Diagnoses include BPPV, M?ni?re's disease, CVA ?   ?Clinical Laboratory Tests Ordered, included CBC with no significant anemia.  Normal platelets.  Glucose of 131.  ? ?Radiologic Tests Ordered, included CT head. I independently interpreted the images and agree with radiology interpretation.  ? ?Cardiac Monitor Tracing which shows NSR ? ? ?Social Determinants of Health Risk prior smoking history.  ? ?Consult complete with EDP, Dr. Wallace Cullens who accepts the patient in ED-ED transfer to [facility] for MRI. Order placed.  ? ?Medical Decision Making: Summary:  ?Patient presents to the emergency department with vertigo.  Symptoms been fairly persistent over the past couple of weeks.  No focal deficits although patient does have slow finger-nose testing.  She does have history of some shoulder joint issues which may be contributing to this.  Describes some atypical chest discomfort.  EKG is reassuring and will obtain a troponin but low suspicion for ACS.  ? ?Reevaluation with update and discussion with patient.  Given meclizine and not significantly improved.  No deterioration in her neuro exam.  Plan for transfer to [facility] for MRI.  ? ?Disposition: transfer ? ?____________________________________________ ? ?FINAL CLINICAL IMPRESSION(S) / ED DIAGNOSES ? ?Final diagnoses:  ?Vertigo  ? ? ? ?Note:  This document was prepared using Dragon voice recognition software and may include unintentional dictation errors. ? ?Alona Bene, MD, FACEP ?Emergency Medicine ? ?  ?Maia Plan, MD ?04/06/21 2315 ? ?

## 2021-04-06 NOTE — ED Triage Notes (Signed)
States has felt like the "room is spinning" x 2 weeks, worse since Tuesday. Hx of vertigo, feels similar. Also has dyspnea on exertion. ?

## 2021-04-06 NOTE — ED Notes (Signed)
Patient transported to CT 

## 2021-04-07 ENCOUNTER — Emergency Department (HOSPITAL_COMMUNITY): Payer: Medicare HMO

## 2021-04-07 LAB — TROPONIN I (HIGH SENSITIVITY): Troponin I (High Sensitivity): 4 ng/L (ref <18)

## 2021-04-07 MED ORDER — LORAZEPAM 2 MG/ML IJ SOLN
1.0000 mg | Freq: Once | INTRAMUSCULAR | Status: AC | PRN
  Administered 2021-04-07: 1 mg via INTRAVENOUS
  Filled 2021-04-07: qty 1

## 2021-04-07 MED ORDER — MECLIZINE HCL 25 MG PO TABS: 25.0000 mg | ORAL_TABLET | Freq: Three times a day (TID) | ORAL | 0 refills | Status: AC | PRN

## 2021-04-07 NOTE — ED Notes (Signed)
Pt states she is here for an MRI with c/o dizziness x 2 weeks. Pt arrives A&O x 4 and in NAD at time triage ?

## 2021-04-07 NOTE — ED Notes (Signed)
Per MD Bero do not need repeat troponin  ?

## 2021-04-07 NOTE — ED Provider Notes (Signed)
?  Provider Note ?MRN:  [id number]  ?Arrival date & time: 04/07/21    ?ED Course and Medical Decision Making  ?Assumed care from Dr. Jacqulyn Bath upon patient transfer. ? ?Vertigo, history of stroke, transferred for MRI.  On my assessment patient feels well, has overall reassuring neurological exam.  MRI has returned without acute stroke, remote lacunar infarct  No emergent process patient appropriate for discharge. ? ?Procedures ? ?Final Clinical Impressions(s) / ED Diagnoses  ? ?  ICD-10-CM   ?1. Vertigo  R42   ?  ?  ?ED Discharge Orders   ? ?      Ordered  ?  meclizine (ANTIVERT) 25 MG tablet  3 times daily PRN       ? 04/07/21 0531  ? ?  ?  ? ?  ?  ? ? ?Discharge Instructions   ? ?  ?You were evaluated in the Emergency Department and after careful evaluation, we did not find any emergent condition requiring admission or further testing in the hospital. ? ?Your exam/testing today was overall reassuring.  MRI without evidence of stroke.  Can use the meclizine as needed for vertigo.  Also recommend trying the Epley maneuver and following up with your primary care doctor. ? ?Please return to the Emergency Department if you experience any worsening of your condition.  Thank you for allowing [place] to be a part of your care. ? ? ? ? ?Elmer Sow. Pilar Plate, MD ?[facility] Emergency Medicine ?[facility] [email or web address] ? ?  ?Sabas Sous, MD ?04/07/21 0533 ? ?

## 2021-04-07 NOTE — Discharge Instructions (Signed)
You were evaluated in the Emergency Department and after careful evaluation, we did not find any emergent condition requiring admission or further testing in the hospital. ? ?Your exam/testing today was overall reassuring.  MRI without evidence of stroke.  Can use the meclizine as needed for vertigo.  Also recommend trying the Epley maneuver and following up with your primary care doctor. ? ?Please return to the Emergency Department if you experience any worsening of your condition.  Thank you for allowing [place] to be a part of your care. ? ?

## 2021-04-07 NOTE — ED Notes (Signed)
Patient in MRI at this time. To room after she finishes ?

## 2021-04-07 NOTE — ED Provider Triage Note (Signed)
Emergency Medicine Provider Triage Evaluation Note ? ?Paige Porter , a 71 y.o. female  was evaluated in triage.  Pt complains of dizziness.  Transferred here from [facility] for MRI.  She is claustrophobic. ? ?Review of Systems  ?Positive: dizziness ?Negative: fever ? ?Physical Exam  ?BP (!) 143/64 (BP Location: Left Arm)   Pulse 73   Temp 98.7 ?F (37.1 ?C) (Oral)   Resp 16   Ht 5\' 2"  (1.575 m)   Wt 117.9 kg   SpO2 100%   BMI 47.55 kg/m?  ?Gen:   Awake, no distress   ?Resp:  Normal effort  ?MSK:   Moves extremities without difficulty  ?Other:   ? ?Medical Decision Making  ?Medically screening exam initiated at 12:13 AM.  Appropriate orders placed.  Kimberely Mccannon was informed that the remainder of the evaluation will be completed by another provider, this initial triage assessment does not replace that evaluation, and the importance of remaining in the ED until their evaluation is complete. ? ?Awaiting MRI-- 1mg  IV ativan ordered prior to MRI as she is claustrophobic. ?  ?Paige Pi, PA-C ?04/07/21 0014 ? ?

## 2022-05-07 ENCOUNTER — Other Ambulatory Visit: Payer: Self-pay

## 2022-05-07 ENCOUNTER — Emergency Department (HOSPITAL_BASED_OUTPATIENT_CLINIC_OR_DEPARTMENT_OTHER)
Admission: EM | Admit: 2022-05-07 | Discharge: 2022-05-07 | Disposition: A | Discharge status: Home / Self Care | Payer: Medicare HMO | Attending: Emergency Medicine | Admitting: Emergency Medicine

## 2022-05-07 ENCOUNTER — Encounter (HOSPITAL_BASED_OUTPATIENT_CLINIC_OR_DEPARTMENT_OTHER): Payer: Self-pay | Admitting: Emergency Medicine

## 2022-05-07 ENCOUNTER — Emergency Department (HOSPITAL_BASED_OUTPATIENT_CLINIC_OR_DEPARTMENT_OTHER): Payer: Medicare HMO

## 2022-05-07 DIAGNOSIS — R600 Localized edema: Secondary | ICD-10-CM | POA: Diagnosis not present

## 2022-05-07 DIAGNOSIS — M79604 Pain in right leg: Secondary | ICD-10-CM | POA: Diagnosis present

## 2022-05-07 NOTE — ED Provider Notes (Signed)
[facility] Provider Note   CSN: [id number] Arrival date & time: 05/07/22  1835     History Chief Complaint  Patient presents with   Knee Pain   Leg Pain    HPI Paige Porter is a 72 y.o. female presenting for chief complaint of right lower extremity pain.  States that she has had bilateral lower extremity swelling and pain for months but it is acutely worsened over the past 2 days.  Denies fevers chills nausea vomiting syncope or shortness of breath.  Now ambulatory at her baseline.  No known sick contacts..   Patient's recorded medical, surgical, social, medication list and allergies were reviewed in the Snapshot window as part of the initial history.   Review of Systems   Review of Systems  Constitutional:  Negative for chills and fever.  HENT:  Negative for ear pain and sore throat.   Eyes:  Negative for pain and visual disturbance.  Respiratory:  Negative for cough and shortness of breath.   Cardiovascular:  Negative for chest pain and palpitations.  Gastrointestinal:  Negative for abdominal pain and vomiting.  Genitourinary:  Negative for dysuria and hematuria.  Musculoskeletal:  Negative for arthralgias and back pain.  Skin:  Negative for color change and rash.  Neurological:  Negative for seizures and syncope.  All other systems reviewed and are negative.   Physical Exam Updated Vital Signs BP (!) 154/67 (BP Location: Left Arm)   Pulse 74   Temp 98.9 F (37.2 C)   Resp 18   Wt 111.1 kg   SpO2 95%   BMI 44.81 kg/m  Physical Exam Vitals and nursing note reviewed.  Constitutional:      General: She is not in acute distress.    Appearance: She is well-developed.  HENT:     Head: Normocephalic and atraumatic.  Eyes:     Conjunctiva/sclera: Conjunctivae normal.  Cardiovascular:     Rate and Rhythm: Normal rate and regular rhythm.     Heart sounds: No murmur heard. Pulmonary:     Effort: Pulmonary effort  is normal. No respiratory distress.     Breath sounds: Normal breath sounds.  Abdominal:     General: There is no distension.     Palpations: Abdomen is soft.     Tenderness: There is no abdominal tenderness. There is no right CVA tenderness or left CVA tenderness.  Musculoskeletal:        General: No swelling or tenderness. Normal range of motion.     Cervical back: Neck supple.     Right lower leg: Edema present.     Left lower leg: Edema present.  Skin:    General: Skin is warm and dry.  Neurological:     General: No focal deficit present.     Mental Status: She is alert and oriented to person, place, and time. Mental status is at baseline.     Cranial Nerves: No cranial nerve deficit.      ED Course/ Medical Decision Making/ A&P    Procedures Procedures   Medications Ordered in ED Medications - No data to display  Medical Decision Making:   This a 72 year old female presenting right lower extremity pain.  Has significant bilateral lower extremity swelling.  States she is worried about a blood clot.  Objective evaluation was performed including x-ray and DVT study with no focal pathology.  Likely distal musculoskeletal injury with no focal pathology appreciated on today's exam.  She is  ambulatory tolerating p.o. intake.  She has diffuse lower extremity edema.  I recommended compression stockings follow-up with PCP for ongoing care management in the outpatient setting.  Given bilateral nature, unlikely to be more emergent pathology.  NSAIDs/Tylenol in the outpatient setting for supportive care reinforced. Clinical Impression:  1. Right leg pain      Discharge   Final Clinical Impression(s) / ED Diagnoses Final diagnoses:  Right leg pain    Rx / DC Orders ED Discharge Orders     None         Glyn Ade, MD 05/07/22 2155

## 2022-05-07 NOTE — ED Triage Notes (Signed)
Right knee burning , right lower leg pain from ankle to knee, reports near fall 2 times due to pain , giving out . Marland Kitchen  No injury .

## 2022-07-21 IMAGING — MR MR HEAD W/O CM
13 of 14 series · 44 of 48 positions shown · non-contrast
Comparison: CT from 04/06/2021.

CLINICAL DATA: Initial evaluation for acute neuro deficit, stroke
suspected.

EXAM:
MRI HEAD WITHOUT CONTRAST
TECHNIQUE: Multiplanar, multiecho pulse sequences of the brain and surrounding
structures were obtained without intravenous contrast.

[Series 5: DWI · axial · 3.0mm · 0.92mm/px · z∈[-98,+70]mm · 7 of 116 slices shown (1 of 4)]
[im 1/116]
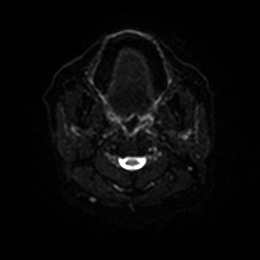
[im 20/116]
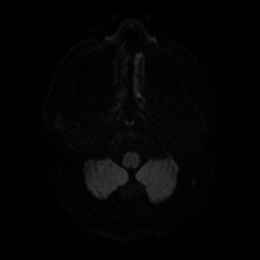
[im 39/116]
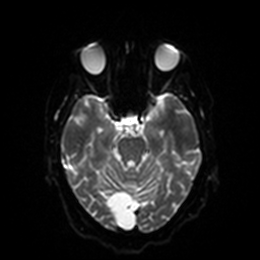
[im 58/116]
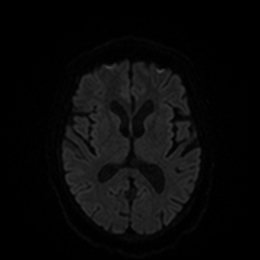
[im 77/116]
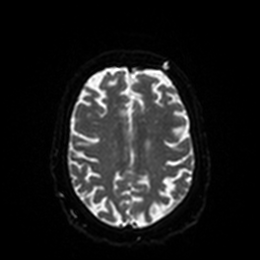
[im 96/116]
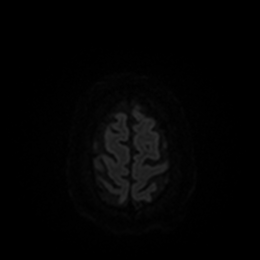
[im 116/116]
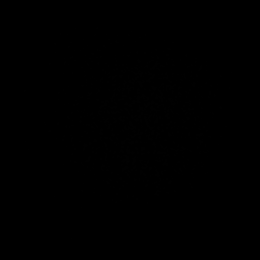

[Series 6: DWI · axial · 3.0mm · 0.92mm/px · z∈[-98,+67]mm · 4 of 57 slices shown (2 of 4)]
[im 1/57]
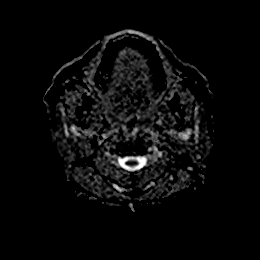
[im 19/57]
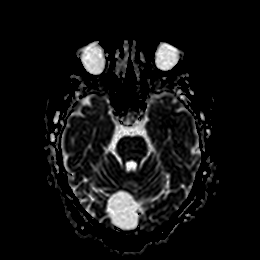
[im 38/57]
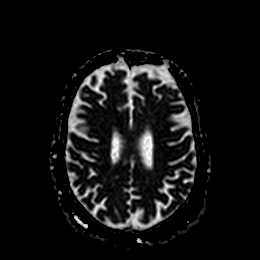
[im 57/57]
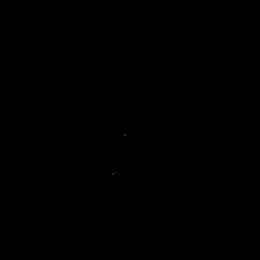

[Series 7: DWI · coronal · 4.0mm · 0.88mm/px · 5 of 80 slices shown (3 of 4)]
[im 1/80]
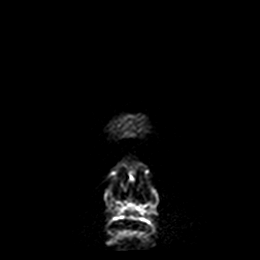
[im 20/80]
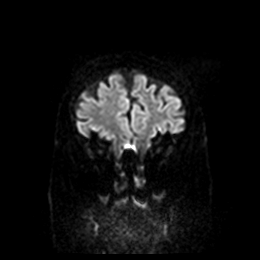
[im 40/80]
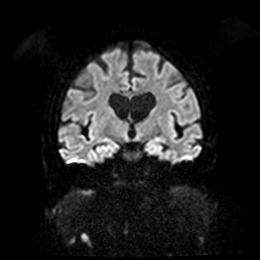
[im 60/80]
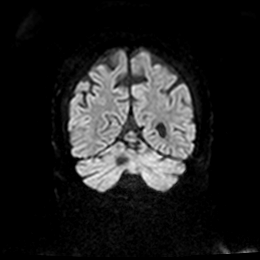
[im 80/80]
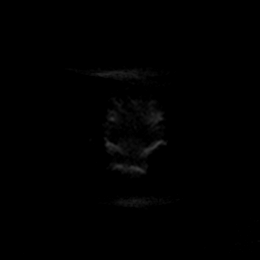

[Series 8: DWI · coronal · 4.0mm · 0.88mm/px · 3 of 40 slices shown (4 of 4)]
[im 1/40]
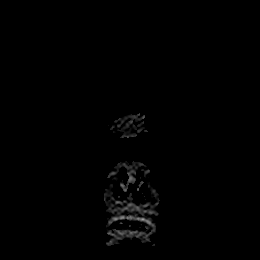
[im 20/40]
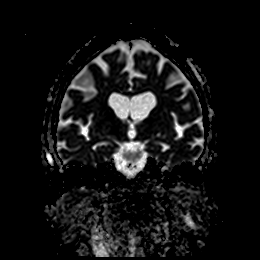
[im 40/40]
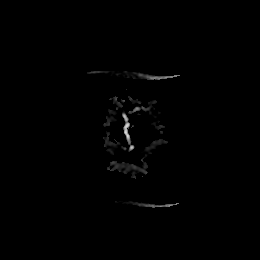

[Series 9: T1 · sagittal · 5.0mm · 0.75mm/px · 2 of 27 slices shown]
[im 1/27]
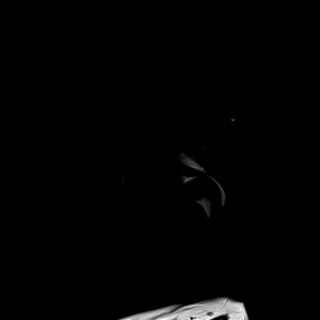
[im 27/27]
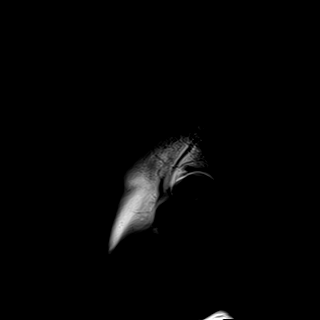

[Series 10: T2 · axial · 5.0mm · 0.75mm/px · z∈[-97,+68]mm · 2 of 29 slices shown (1 of 3)]
[im 1/29]
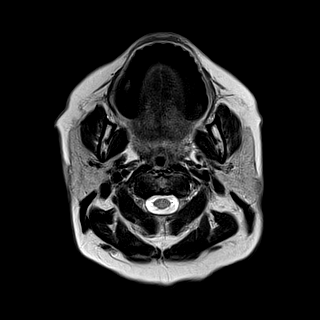
[im 29/29]
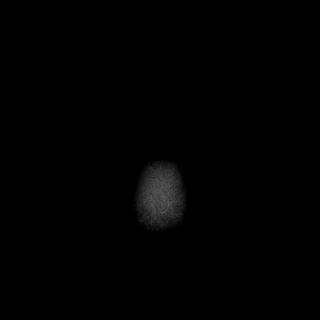

[Series 11: FLAIR · axial · 5.0mm · 0.47mm/px · z∈[-99,+66]mm · 2 of 29 slices shown]
[im 1/29]
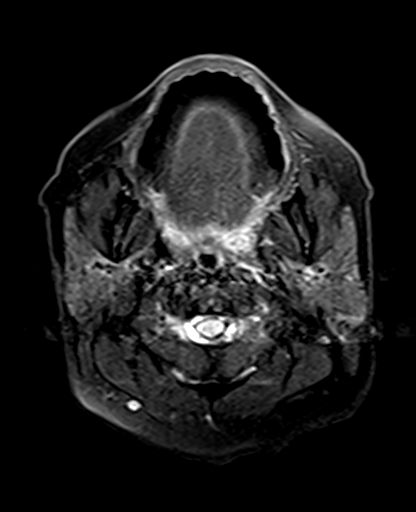
[im 29/29]
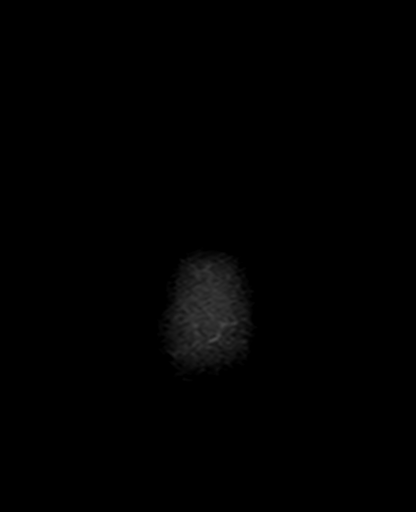

[Series 12: mag_images · axial · 3.0mm · 0.94mm/px · z∈[-101,+60]mm · 4 of 56 slices shown]
[im 1/56]
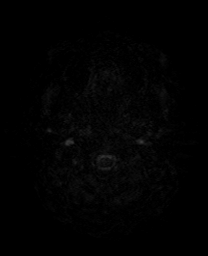
[im 19/56]
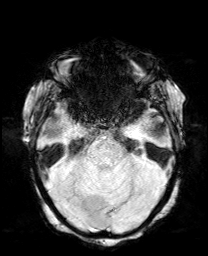
[im 37/56]
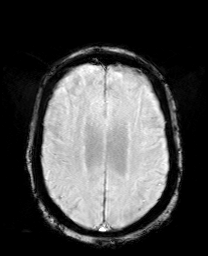
[im 56/56]
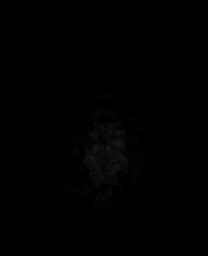

[Series 13: pha_images · axial · 3.0mm · 0.94mm/px · z∈[-98,+60]mm · 4 of 55 slices shown]
[im 1/55]
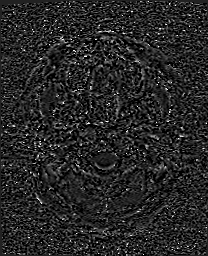
[im 19/55]
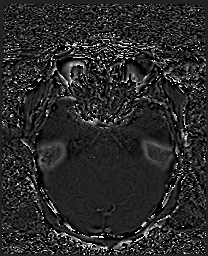
[im 37/55]
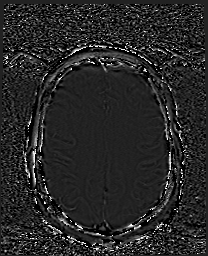
[im 55/55]
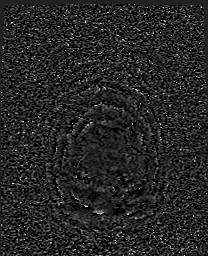

[Series 14: swi_images · axial · 3.0mm · 0.94mm/px · z∈[-101,+60]mm · 4 of 56 slices shown]
[im 1/56]
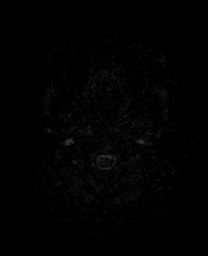
[im 19/56]
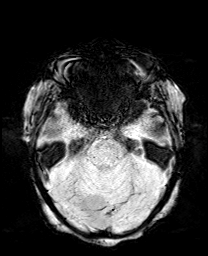
[im 37/56]
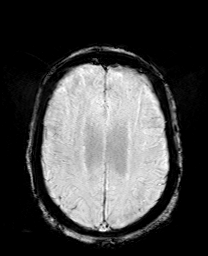
[im 56/56]
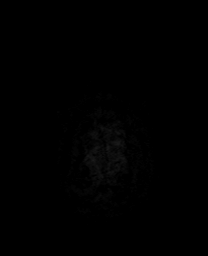

[Series 15: mip_images(sw) · axial · 24.0mm · 0.94mm/px · z∈[-91,+50]mm · 3 of 49 slices shown]
[im 1/49]
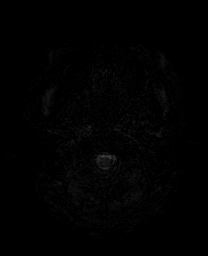
[im 25/49]
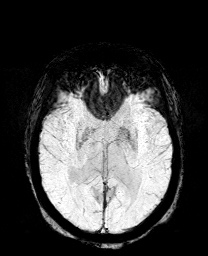
[im 49/49]
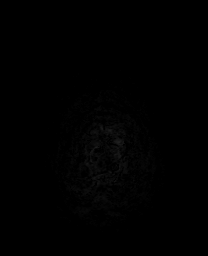

[Series 17: T2 · coronal · 5.0mm · 0.34mm/px · 2 of 34 slices shown (2 of 3)]
[im 1/34]
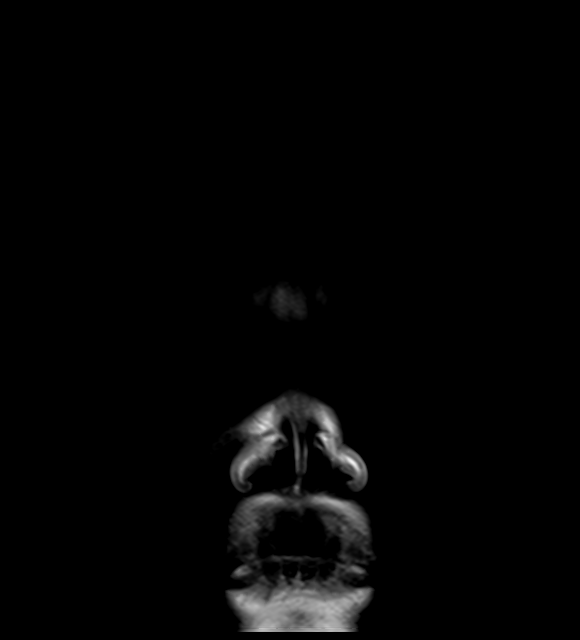
[im 34/34]
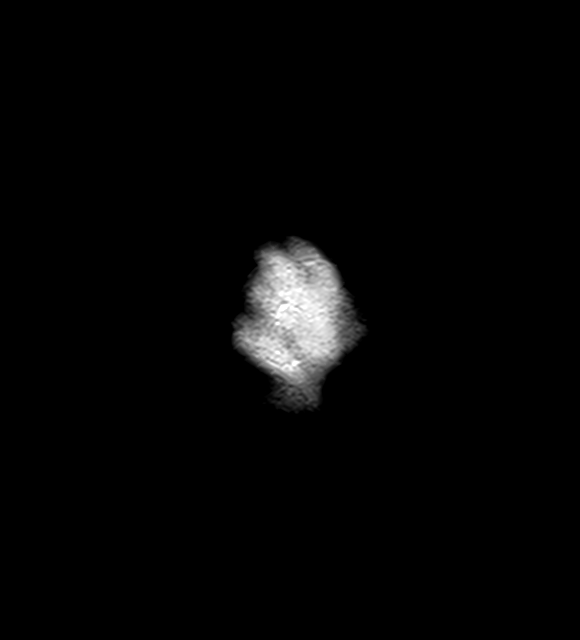

[Series 18: T2 · coronal · 5.0mm · 0.72mm/px · 2 of 33 slices shown (3 of 3)]
[im 1/33]
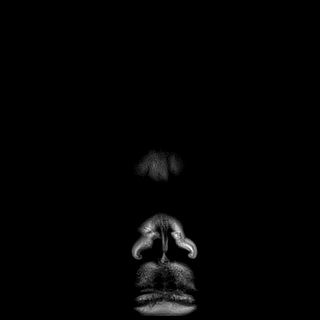
[im 33/33]
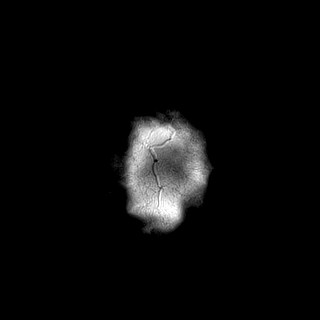

[44 of 48 positions shown; findings below may reference images not displayed]

FINDINGS: Brain: Cerebral volume within normal limits. No significant cerebral
white matter disease for age. Probable tiny remote lacunar infarct
at the posterior right lentiform nucleus (series 11, image 15).

No evidence for acute or subacute ischemia. Gray-white matter
differentiation maintained. No areas of chronic cortical infarction.
No acute or chronic intracranial blood products.

Incidental benign retro cerebellar benign arachnoid cyst. No other
mass lesion, midline shift or mass effect. No hydrocephalus or
extra-axial fluid collection. Pituitary gland suprasellar region
within normal limits. Midline structures intact and normal.

Vascular: Major intracranial vascular flow voids are maintained.

Skull and upper cervical spine: Craniocervical junction within
normal limits. Bone marrow signal intensity normal. No scalp soft
tissue abnormality.

Sinuses/Orbits: Prior bilateral ocular lens replacement. Paranasal
sinuses are largely clear. No mastoid effusion. Inner ear structures
grossly normal.

Other: None.
IMPRESSION: 1. No acute intracranial abnormality.
2. Probable tiny remote lacunar infarct at the posterior right
lentiform nucleus.
